# Patient Record
Sex: Female | Born: 1960 | Race: White | Hispanic: Yes | Marital: Married | Smoking: Former smoker
Health system: Southern US, Community
[De-identification: ages and names within clinical notes are randomized; demographics above are authoritative.]

## PROBLEM LIST (undated history)

## (undated) DIAGNOSIS — B019 Varicella without complication: Secondary | ICD-10-CM

## (undated) DIAGNOSIS — N390 Urinary tract infection, site not specified: Secondary | ICD-10-CM

## (undated) DIAGNOSIS — G2581 Restless legs syndrome: Secondary | ICD-10-CM

## (undated) DIAGNOSIS — M069 Rheumatoid arthritis, unspecified: Secondary | ICD-10-CM

## (undated) DIAGNOSIS — K649 Unspecified hemorrhoids: Secondary | ICD-10-CM

## (undated) DIAGNOSIS — B269 Mumps without complication: Secondary | ICD-10-CM

## (undated) DIAGNOSIS — R32 Unspecified urinary incontinence: Secondary | ICD-10-CM

## (undated) DIAGNOSIS — K219 Gastro-esophageal reflux disease without esophagitis: Secondary | ICD-10-CM

## (undated) DIAGNOSIS — E785 Hyperlipidemia, unspecified: Secondary | ICD-10-CM

## (undated) HISTORY — DX: Unspecified hemorrhoids: K64.9

## (undated) HISTORY — DX: Unspecified urinary incontinence: R32

## (undated) HISTORY — DX: Rheumatoid arthritis, unspecified: M06.9

## (undated) HISTORY — DX: Varicella without complication: B01.9

## (undated) HISTORY — DX: Mumps without complication: B26.9

## (undated) HISTORY — DX: Hyperlipidemia, unspecified: E78.5

## (undated) HISTORY — DX: Gastro-esophageal reflux disease without esophagitis: K21.9

## (undated) HISTORY — PX: BREAST ENHANCEMENT SURGERY: SHX7

## (undated) HISTORY — PX: KNEE ARTHROSCOPY: SUR90

## (undated) HISTORY — DX: Urinary tract infection, site not specified: N39.0

## (undated) HISTORY — DX: Restless legs syndrome: G25.81

## (undated) HISTORY — PX: FOOT SURGERY: SHX648

## (undated) HISTORY — PX: AUGMENTATION MAMMAPLASTY: SUR837

---
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
## 2003-10-30 HISTORY — PX: AUGMENTATION MAMMAPLASTY: SUR837

## 2011-10-30 HISTORY — PX: ABDOMINAL HYSTERECTOMY: SHX81

## 2014-02-19 ENCOUNTER — Ambulatory Visit (INDEPENDENT_AMBULATORY_CARE_PROVIDER_SITE_OTHER): Payer: Commercial Managed Care - PPO | Admitting: Physician Assistant

## 2014-02-19 ENCOUNTER — Encounter: Payer: Self-pay | Admitting: Physician Assistant

## 2014-02-19 ENCOUNTER — Encounter: Payer: Self-pay | Admitting: Gastroenterology

## 2014-02-19 VITALS — BP 96/70 | HR 72 | Temp 98.2°F | Resp 16 | Ht 63.5 in | Wt 163.0 lb

## 2014-02-19 DIAGNOSIS — Z23 Encounter for immunization: Secondary | ICD-10-CM

## 2014-02-19 DIAGNOSIS — Z1239 Encounter for other screening for malignant neoplasm of breast: Secondary | ICD-10-CM

## 2014-02-19 DIAGNOSIS — Z0000 Encounter for general adult medical examination without abnormal findings: Secondary | ICD-10-CM

## 2014-02-19 DIAGNOSIS — K648 Other hemorrhoids: Secondary | ICD-10-CM

## 2014-02-19 DIAGNOSIS — Z1211 Encounter for screening for malignant neoplasm of colon: Secondary | ICD-10-CM

## 2014-02-19 DIAGNOSIS — M069 Rheumatoid arthritis, unspecified: Secondary | ICD-10-CM

## 2014-02-19 DIAGNOSIS — F319 Bipolar disorder, unspecified: Secondary | ICD-10-CM

## 2014-02-19 LAB — CBC WITH DIFFERENTIAL/PLATELET
Basophils Absolute: 0 10*3/uL (ref 0.0–0.1)
Basophils Relative: 0 % (ref 0–1)
EOS PCT: 2 % (ref 0–5)
Eosinophils Absolute: 0.1 10*3/uL (ref 0.0–0.7)
HEMATOCRIT: 36.4 % (ref 36.0–46.0)
Hemoglobin: 12.5 g/dL (ref 12.0–15.0)
LYMPHS ABS: 1.6 10*3/uL (ref 0.7–4.0)
LYMPHS PCT: 35 % (ref 12–46)
MCH: 30.3 pg (ref 26.0–34.0)
MCHC: 34.3 g/dL (ref 30.0–36.0)
MCV: 88.1 fL (ref 78.0–100.0)
MONO ABS: 0.6 10*3/uL (ref 0.1–1.0)
MONOS PCT: 13 % — AB (ref 3–12)
Neutro Abs: 2.3 10*3/uL (ref 1.7–7.7)
Neutrophils Relative %: 50 % (ref 43–77)
Platelets: 195 10*3/uL (ref 150–400)
RBC: 4.13 MIL/uL (ref 3.87–5.11)
RDW: 13 % (ref 11.5–15.5)
WBC: 4.6 10*3/uL (ref 4.0–10.5)

## 2014-02-19 LAB — BASIC METABOLIC PANEL
BUN: 15 mg/dL (ref 6–23)
CALCIUM: 9.5 mg/dL (ref 8.4–10.5)
CO2: 30 meq/L (ref 19–32)
CREATININE: 0.73 mg/dL (ref 0.50–1.10)
Chloride: 103 mEq/L (ref 96–112)
GLUCOSE: 90 mg/dL (ref 70–99)
Potassium: 4.2 mEq/L (ref 3.5–5.3)
Sodium: 141 mEq/L (ref 135–145)

## 2014-02-19 LAB — LIPID PANEL
Cholesterol: 249 mg/dL — ABNORMAL HIGH (ref 0–200)
HDL: 67 mg/dL (ref >39)
LDL CALC: 163 mg/dL — AB (ref 0–99)
Total CHOL/HDL Ratio: 3.7 Ratio
Triglycerides: 96 mg/dL (ref <150)
VLDL: 19 mg/dL (ref 0–40)

## 2014-02-19 LAB — HEPATIC FUNCTION PANEL
ALBUMIN: 4.3 g/dL (ref 3.5–5.2)
ALT: 11 U/L (ref 0–35)
AST: 13 U/L (ref 0–37)
Alkaline Phosphatase: 51 U/L (ref 39–117)
Bilirubin, Direct: <0.1 mg/dL (ref 0.0–0.3)
TOTAL PROTEIN: 6.9 g/dL (ref 6.0–8.3)
Total Bilirubin: 0.3 mg/dL (ref 0.2–1.2)

## 2014-02-19 LAB — HEMOGLOBIN A1C
HEMOGLOBIN A1C: 5.7 % — AB (ref <5.7)
MEAN PLASMA GLUCOSE: 117 mg/dL — AB (ref <117)

## 2014-02-19 LAB — TSH: TSH: 3.723 u[IU]/mL (ref 0.350–4.500)

## 2014-02-19 LAB — T4: T4 TOTAL: 5.9 ug/dL (ref 5.0–12.5)

## 2014-02-19 MED ORDER — HYDROCORTISONE ACETATE 25 MG RE SUPP: 25.0000 mg | Freq: Two times a day (BID) | RECTAL | Status: DC

## 2014-02-19 MED ORDER — DIVALPROEX SODIUM 125 MG PO DR TAB: 125.0000 mg | DELAYED_RELEASE_TABLET | Freq: Three times a day (TID) | ORAL | Status: DC

## 2014-02-19 NOTE — Progress Notes (Signed)
Patient presents to clinic today to establish care.  Patient is also fasting for labs.  Acute Concerns: Patient c/o continued mania and depression symptoms consistent with Bipolar Disorder.  Currently on Tegretol 200 mg BID and Celexa 20 mg daily.  Denies SI/HI but does endorse continued depressed mood that alternated with mania.  Husband is present in the room and states she has manic episodes every few weeks.  Denies hospitalization.  Patient has tried numerous medications in the past incluiding lithium, prozac and paxil which she could not tolerate.  Is not currently followed by Psychiatry as she cannot find a specialist in the area that will take her insurance.  In the process of setting her up with a counselor.  Patient also complains of hemorrhoids with occasional BRBPR.  States she was just daignosed with hemorrhoids by her OB/GYN but was not given medication.  Denies pain, tenesmus or melena.  Is due for colonoscopy.  Denies family hx of colorectal cancer.  Chronic Issues: Rheumatoid Arthritis -- new diagnosis.  Followed by speciality.  RLS -- controlled currently without medication.  Health Maintenance: Dental --  Overdue Vision -- Overdue Immunizations -- Unsure of last tetanus.  Will need today. Colonoscopy -- Has never had.  Age 53.   [facility] -- [facility] PAP -- last PAP in January.  Followed by OB/GYN.    Past Medical History  Diagnosis Date  . Chronic rheumatic arthritis   . Hemorrhoids   . Chicken pox   . Mumps   . Hyperlipidemia     Borderline  . Urinary incontinence     Leakage  . UTI (lower urinary tract infection)   . GERD (gastroesophageal reflux disease)   . RLS (restless legs syndrome)     Past Surgical History  Procedure Laterality Date  . Breast enhancement surgery  2005/1992  . Foot surgery      Left, Bers[place]  . Tonsillectomy and adenoidectomy  1972  . Abdominal hysterectomy  2013    No current outpatient  prescriptions on file prior to visit.   No current facility-administered medications on file prior to visit.    Allergies  Allergen Reactions  . Lithium Other (See Comments)    Boils  . Penicillins Hives    Family History  Problem Relation Age of Onset  . Mental illness Mother     Living  . Dementia Mother   . Parkinson's disease Father     Deceased  . Alzheimer's disease Father   . Cancer Maternal Grandmother   . Diabetes Maternal Uncle   . Arthritis Maternal Aunt     Psoriatic   . Alcohol abuse Brother   . Healthy Brother   . Healthy Sister   . Allergies Daughter   . Healthy Daughter   . Healthy Son     x2    History   Social History  . Marital Status: Married    Spouse Name: N/A    Number of Children: N/A  . Years of Education: N/A   Occupational History  . Not on file.   Social History Main Topics  . Smoking status: Current Some Day Smoker  . Smokeless tobacco: Never Used  . Alcohol Use: Not on file     Comment: very rare  . Drug Use: No  . Sexual Activity: Not on file   Other Topics Concern  . Not on file   Social History Narrative  . No narrative on file   Review of Systems  Constitutional: Negative for fever and weight loss.  HENT: Negative for ear pain, hearing loss and tinnitus.   Eyes: Negative for blurred vision and double vision.  Respiratory: Negative for shortness of breath.   Cardiovascular: Negative for chest pain and palpitations.  Gastrointestinal: Positive for blood in stool. Negative for heartburn, nausea, vomiting, abdominal pain, diarrhea, constipation and melena.  Genitourinary: Negative.   Neurological: Negative for dizziness, seizures, loss of consciousness and headaches.  Psychiatric/Behavioral: Positive for depression. Negative for suicidal ideas, hallucinations, memory loss and substance abuse. The patient is nervous/anxious. The patient does not have insomnia.    BP 96/70  Pulse 72  Temp(Src) 98.2 F (36.8 C) (Oral)   Resp 16  Ht 5' 3.5" (1.613 m)  Wt 163 lb (73.936 kg)  BMI 28.42 kg/m2  SpO2 98%  Physical Exam  Vitals reviewed. Constitutional: She is oriented to person, place, and time and well-developed, well-nourished, and in no distress.  HENT:  Head: Normocephalic and atraumatic.  Right Ear: External ear normal.  Left Ear: External ear normal.  Nose: Nose normal.  Mouth/Throat: Oropharynx is clear and moist. No oropharyngeal exudate.  TM within normal limits bilaterally.  Eyes: Conjunctivae and EOM are normal. Pupils are equal, round, and reactive to light.  Neck: Neck supple. No thyromegaly present.  Cardiovascular: Normal rate, regular rhythm, normal heart sounds and intact distal pulses.   Pulmonary/Chest: Effort normal and breath sounds normal. No respiratory distress. She has no wheezes. She has no rales. She exhibits no tenderness.  Abdominal: Soft. Bowel sounds are normal. She exhibits no distension and no mass. There is no tenderness. There is no rebound and no guarding.  Lymphadenopathy:    She has no cervical adenopathy.  Neurological: She is alert and oriented to person, place, and time.  Skin: Skin is warm and dry. No rash noted.  Psychiatric: Memory, affect and judgment normal.   Assessment/Plan: Internal hemorrhoids Rx Anusol. Sitx bath.  Fiber supplement. Scheduled with GI for colonoscopy for CRC screening.  Bipolar disorder Will titrate off of Tegretol while titrating up on Depakote.  Will continue current regimen of Celexa.  Discussed need for specialist input giving treatment failure in the past.  Patient declines at present time.  Follow-up in 2 weeks.  Will reassess symptoms at that time and increase Depakote dosing to reach therapeutic response.  Discussed alarm signs/symptoms with patient and husband.  They know when to proceed to ER.  Visit for preventive health examination Medical history reviewed and updated.  Tetanus immunization given.  Mammogram order placed  for breast cancer screening.  Referral placed to GI for screening colonoscopy and for recurrent hemorrhoids.  Will obtain fasting labs.

## 2014-02-19 NOTE — Progress Notes (Signed)
Pre visit review using our clinic review tool, if applicable. No additional management support is needed unless otherwise documented below in the visit note/SLS  

## 2014-02-19 NOTE — Patient Instructions (Addendum)
Please obtain labs.  I will call you with your results.  You will be contacted for Mammogram and Colonoscopy. Please take the anusol as directed for hemorrhoids.  Increase fluid intake.  Avoid straining.  The colonosocpy will further guide [place] in treatment if not improving with medication.  For Bipolar Disorder -- Continue Celexa for now.  We are switching your from the Tegretol to Depakote.  For the Tegretol -- Start taking 1 pill twice daily for 1 week.  Then take 1 pill daily for an additional week.  At the same time you will start to take the Depakote.  Take 1 tablet twice per day for 1 week. Then increase to 1 tablet three times a day for 1 week.  Then you will take 2 tablets three times per day.  Follow-up in 2 weeks.  If you develop any severe manic symptoms, or if you develop thoughts of harming yourself or others, please call 911 or proceed to the ER.

## 2014-02-20 LAB — URINALYSIS, ROUTINE W REFLEX MICROSCOPIC
Bilirubin Urine: NEGATIVE
Glucose, UA: NEGATIVE mg/dL
Hgb urine dipstick: NEGATIVE
KETONES UR: NEGATIVE mg/dL
Leukocytes, UA: NEGATIVE
NITRITE: NEGATIVE
PROTEIN: NEGATIVE mg/dL
Specific Gravity, Urine: 1.014 (ref 1.005–1.030)
UROBILINOGEN UA: 0.2 mg/dL (ref 0.0–1.0)
pH: 7 (ref 5.0–8.0)

## 2014-02-22 ENCOUNTER — Telehealth: Payer: Self-pay | Admitting: Physician Assistant

## 2014-02-22 NOTE — Telephone Encounter (Signed)
Relevant patient education mailed to patient.  

## 2014-02-24 DIAGNOSIS — Z0000 Encounter for general adult medical examination without abnormal findings: Secondary | ICD-10-CM | POA: Insufficient documentation

## 2014-02-24 DIAGNOSIS — F319 Bipolar disorder, unspecified: Secondary | ICD-10-CM | POA: Insufficient documentation

## 2014-02-24 DIAGNOSIS — M069 Rheumatoid arthritis, unspecified: Secondary | ICD-10-CM | POA: Insufficient documentation

## 2014-02-24 DIAGNOSIS — K648 Other hemorrhoids: Secondary | ICD-10-CM | POA: Insufficient documentation

## 2014-02-24 NOTE — Assessment & Plan Note (Signed)
Medical history reviewed and updated.  Tetanus immunization given.  Mammogram order placed for breast cancer screening.  Referral placed to GI for screening colonoscopy and for recurrent hemorrhoids.  Will obtain fasting labs.

## 2014-02-24 NOTE — Assessment & Plan Note (Addendum)
Rx Anusol. Sitx bath.  Fiber supplement. Scheduled with GI for colonoscopy for CRC screening.

## 2014-02-24 NOTE — Assessment & Plan Note (Signed)
Will titrate off of Tegretol while titrating up on Depakote.  Will continue current regimen of Celexa.  Discussed need for specialist input giving treatment failure in the past.  Patient declines at present time.  Follow-up in 2 weeks.  Will reassess symptoms at that time and increase Depakote dosing to reach therapeutic response.  Discussed alarm signs/symptoms with patient and husband.  They know when to proceed to ER.

## 2014-03-01 ENCOUNTER — Telehealth: Payer: Self-pay | Admitting: Physician Assistant

## 2014-03-01 NOTE — Telephone Encounter (Signed)
Patient's husband called stating we prescribed depakote and she now has hard pimples on her forehead. She would like to know if this will go away, should she stop taking the medication, or is there a cream she can use? Patient's husband states the medication is working. Pt uses [facility] [address]

## 2014-03-01 NOTE — Telephone Encounter (Signed)
You can sometimes get a rash with Depakote although rare.  However, this could be completely unrelated to Depakote. She is due for follow-up next week.  We can evaluate it at that time or they can come in to office sooner.  I would need to know more about the rash and examine it to know what treatments, if needed, would be effective.

## 2014-03-01 NOTE — Telephone Encounter (Signed)
Please Advise/SLS  

## 2014-03-01 NOTE — Telephone Encounter (Signed)
Spoke with patient's husband [caller], patient is at work, will check when she calls home to see if she wants to come in earlier than scheduled 05.14.15 appt; reporting again "hard pimple-like bumps on forehead", provider informed/SLS

## 2014-03-11 ENCOUNTER — Ambulatory Visit: Payer: Commercial Managed Care - PPO | Admitting: Physician Assistant

## 2014-03-13 ENCOUNTER — Other Ambulatory Visit: Payer: Self-pay | Admitting: Physician Assistant

## 2014-03-18 ENCOUNTER — Telehealth: Payer: Self-pay | Admitting: Physician Assistant

## 2014-03-18 ENCOUNTER — Encounter: Payer: Self-pay | Admitting: Physician Assistant

## 2014-03-18 ENCOUNTER — Ambulatory Visit (INDEPENDENT_AMBULATORY_CARE_PROVIDER_SITE_OTHER): Payer: Commercial Managed Care - PPO | Admitting: Physician Assistant

## 2014-03-18 VITALS — BP 90/64 | HR 75 | Temp 98.3°F | Resp 16 | Ht 63.5 in | Wt 162.0 lb

## 2014-03-18 DIAGNOSIS — F319 Bipolar disorder, unspecified: Secondary | ICD-10-CM

## 2014-03-18 DIAGNOSIS — A6000 Herpesviral infection of urogenital system, unspecified: Secondary | ICD-10-CM

## 2014-03-18 MED ORDER — DIVALPROEX SODIUM 250 MG PO DR TAB: DELAYED_RELEASE_TABLET | ORAL | Status: DC

## 2014-03-18 MED ORDER — CITALOPRAM HYDROBROMIDE 20 MG PO TABS: 20.0000 mg | ORAL_TABLET | Freq: Every day | ORAL | Status: DC

## 2014-03-18 MED ORDER — ACYCLOVIR 800 MG PO TABS: 800.0000 mg | ORAL_TABLET | Freq: Every day | ORAL | Status: DC

## 2014-03-18 NOTE — Assessment & Plan Note (Signed)
Doing well. Will increase Depakote to 250 mg TID. Continue current dose of Celexa.  Will check Depakote level.  Follow-up in 1 month.

## 2014-03-18 NOTE — Progress Notes (Signed)
Patient presents to clinic today for follow-up of bipolar disorder after starting Depakote.  Patient currently on 125 mg TID.  Continues Celexa.  States she has noted a marked improvement in her mood.  Endorses that her mood swings are much less frequent and severe.  Denies SI/HI.    Patient is requesting refill of Acyclovir.  Takes 1 tablet daily for genital herpes prophylaxis.  Past Medical History  Diagnosis Date  . Chronic rheumatic arthritis   . Hemorrhoids   . Chicken pox   . Mumps   . Hyperlipidemia     Borderline  . Urinary incontinence     Leakage  . UTI (lower urinary tract infection)   . GERD (gastroesophageal reflux disease)   . RLS (restless legs syndrome)     Current Outpatient Prescriptions on File Prior to Visit  Medication Sig Dispense Refill  . Adalimumab (HUMIRA PEN [facility]) Inject into the skin. HAVE NOT YET STARTED AS OF 04.24.14      . ALPRAZolam (XANAX) 0.5 MG tablet Take 0.5 mg by mouth as needed for anxiety.      . Ascorbic Acid (VITAMIN C) 1000 MG tablet Take 1,000 mg by mouth daily.      . Bacillus Coagulans-Inulin (PROBIOTIC FORMULA) 1-250 BILLION-MG CAPS Take by mouth daily.      . Cranberry-Vitamin C 84-20 MG CAPS Take by mouth daily.      . hydrocortisone (ANUSOL-HC) 25 MG suppository Place 1 suppository (25 mg total) rectally 2 (two) times daily.  12 suppository  0  . Multiple Vitamins-Minerals (CENTRUM ADULTS) TABS Take by mouth daily.      Marland Kitchen rOPINIRole (REQUIP) 0.25 MG tablet Take 0.25 mg by mouth as needed (RLS).       No current facility-administered medications on file prior to visit.    Allergies  Allergen Reactions  . Lithium Other (See Comments)    Boils  . Penicillins Hives    Family History  Problem Relation Age of Onset  . Mental illness Mother     Living  . Dementia Mother   . Parkinson's disease Father     Deceased  . Alzheimer's disease Father   . Cancer Maternal Grandmother   . Diabetes Maternal Uncle   . Arthritis Maternal  Aunt     Psoriatic   . Alcohol abuse Brother   . Healthy Brother   . Healthy Sister   . Allergies Daughter   . Healthy Daughter   . Healthy Son     x2    History   Social History  . Marital Status: Married    Spouse Name: N/A    Number of Children: N/A  . Years of Education: N/A   Social History Main Topics  . Smoking status: Current Some Day Smoker  . Smokeless tobacco: Never Used  . Alcohol Use: None     Comment: very rare  . Drug Use: No  . Sexual Activity: None   Other Topics Concern  . None   Social History Narrative  . None   Review of Systems - See HPI.  All other ROS are negative.  BP 90/64  Pulse 75  Temp(Src) 98.3 F (36.8 C) (Oral)  Resp 16  Ht 5' 3.5" (1.613 m)  Wt 162 lb (73.483 kg)  BMI 28.24 kg/m2  SpO2 98%  Physical Exam  Vitals reviewed. Constitutional: She is oriented to person, place, and time and well-developed, well-nourished, and in no distress.  HENT:  Head: Normocephalic and atraumatic.  Eyes:  Conjunctivae are normal. Pupils are equal, round, and reactive to light.  Cardiovascular: Normal rate, regular rhythm, normal heart sounds and intact distal pulses.   Pulmonary/Chest: Effort normal and breath sounds normal. No respiratory distress. She has no wheezes. She has no rales. She exhibits no tenderness.  Neurological: She is alert and oriented to person, place, and time.  Skin: Skin is warm and dry.  Psychiatric: Mood, memory, affect and judgment normal.    Recent Results (from the past 2160 hour(s))  CBC WITH DIFFERENTIAL     Status: Abnormal   Collection Time    02/19/14  8:48 AM      Result Value Ref Range   WBC 4.6  4.0 - 10.5 K/uL   RBC 4.13  3.87 - 5.11 MIL/uL   Hemoglobin 12.5  12.0 - 15.0 g/dL   HCT 36.4  36.0 - 46.0 %   MCV 88.1  78.0 - 100.0 fL   MCH 30.3  26.0 - 34.0 pg   MCHC 34.3  30.0 - 36.0 g/dL   RDW 13.0  11.5 - 15.5 %   Platelets 195  150 - 400 K/uL   Neutrophils Relative % 50  43 - 77 %   Neutro Abs 2.3   1.7 - 7.7 K/uL   Lymphocytes Relative 35  12 - 46 %   Lymphs Abs 1.6  0.7 - 4.0 K/uL   Monocytes Relative 13 (*) 3 - 12 %   Monocytes Absolute 0.6  0.1 - 1.0 K/uL   Eosinophils Relative 2  0 - 5 %   Eosinophils Absolute 0.1  0.0 - 0.7 K/uL   Basophils Relative 0  0 - 1 %   Basophils Absolute 0.0  0.0 - 0.1 K/uL   Smear Review Criteria for review not met    BASIC METABOLIC PANEL     Status: None   Collection Time    02/19/14  8:48 AM      Result Value Ref Range   Sodium 141  135 - 145 mEq/L   Potassium 4.2  3.5 - 5.3 mEq/L   Chloride 103  96 - 112 mEq/L   CO2 30  19 - 32 mEq/L   Glucose, Bld 90  70 - 99 mg/dL   BUN 15  6 - 23 mg/dL   Creat 0.73  0.50 - 1.10 mg/dL   Calcium 9.5  8.4 - 10.5 mg/dL  HEPATIC FUNCTION PANEL     Status: None   Collection Time    02/19/14  8:48 AM      Result Value Ref Range   Total Bilirubin 0.3  0.2 - 1.2 mg/dL   Bilirubin, Direct <0.1  0.0 - 0.3 mg/dL   Indirect Bilirubin NOT CALC  0.2 - 1.2 mg/dL   Alkaline Phosphatase 51  39 - 117 U/L   AST 13  0 - 37 U/L   ALT 11  0 - 35 U/L   Total Protein 6.9  6.0 - 8.3 g/dL   Albumin 4.3  3.5 - 5.2 g/dL  TSH     Status: None   Collection Time    02/19/14  8:48 AM      Result Value Ref Range   TSH 3.723  0.350 - 4.500 uIU/mL  HEMOGLOBIN A1C     Status: Abnormal   Collection Time    02/19/14  8:48 AM      Result Value Ref Range   Hemoglobin A1C 5.7 (*) <5.7 %   Comment:  According to the ADA Clinical Practice Recommendations for 2011, when     HbA1c is used as a screening test:             >=6.5%   Diagnostic of Diabetes Mellitus                (if abnormal result is confirmed)           5.7-6.4%   Increased risk of developing Diabetes Mellitus           References:Diagnosis and Classification of Diabetes Mellitus,Diabetes     KPTW,6568,12(XNTZG 1):S62-S69 and Standards of Medical Care in             Diabetes -  2011,Diabetes YFVC,9449,67 (Suppl 1):S11-S61.         Mean Plasma Glucose 117 (*) <117 mg/dL  URINALYSIS, ROUTINE W REFLEX MICROSCOPIC     Status: None   Collection Time    02/19/14  8:48 AM      Result Value Ref Range   Color, Urine YELLOW  YELLOW   APPearance CLEAR  CLEAR   Specific Gravity, Urine 1.014  1.005 - 1.030   pH 7.0  5.0 - 8.0   Glucose, UA NEG  NEG mg/dL   Bilirubin Urine NEG  NEG   Ketones, ur NEG  NEG mg/dL   Hgb urine dipstick NEG  NEG   Protein, ur NEG  NEG mg/dL   Urobilinogen, UA 0.2  0.0 - 1.0 mg/dL   Nitrite NEG  NEG   Leukocytes, UA NEG  NEG  LIPID PANEL     Status: Abnormal   Collection Time    02/19/14  8:48 AM      Result Value Ref Range   Cholesterol 249 (*) 0 - 200 mg/dL   Comment: ATP III Classification:           < 200        mg/dL        Desirable          200 - 239     mg/dL        Borderline High          >= 240        mg/dL        High         Triglycerides 96  <150 mg/dL   HDL 67  >39 mg/dL   Total CHOL/HDL Ratio 3.7     VLDL 19  0 - 40 mg/dL   LDL Cholesterol 163 (*) 0 - 99 mg/dL   Comment:       Total Cholesterol/HDL Ratio:CHD Risk                            Coronary Heart Disease Risk Table                                            Men       Women              1/2 Average Risk              3.4        3.3                  Average Risk  5.0        4.4               2X Average Risk              9.6        7.1               3X Average Risk             23.4       11.0     Use the calculated Patient Ratio above and the CHD Risk table      to determine the patient's CHD Risk.     ATP III Classification (LDL):           < 100        mg/dL         Optimal          100 - 129     mg/dL         Near or Above Optimal          130 - 159     mg/dL         Borderline High          160 - 189     mg/dL         High           > 190        mg/dL         Very High        T4     Status: None   Collection Time    02/19/14  8:48 AM       Result Value Ref Range   T4, Total 5.9  5.0 - 12.5 ug/dL   Assessment/Plan: Bipolar disorder Doing well. Will increase Depakote to 250 mg TID. Continue current dose of Celexa.  Will check Depakote level.  Follow-up in 1 month.

## 2014-03-18 NOTE — Progress Notes (Signed)
Pre visit review using our clinic review tool, if applicable. No additional management support is needed unless otherwise documented below in the visit note/SLS  

## 2014-03-18 NOTE — Patient Instructions (Signed)
Please continue Citalopram as directed.  Start new depakote dose. 250 mg three times per day.  Please obtain labs.  Follow-up in 1 month.

## 2014-03-18 NOTE — Telephone Encounter (Signed)
Relevant patient education mailed to patient.  

## 2014-03-19 LAB — VALPROIC ACID LEVEL: Valproic Acid Lvl: 34.7 ug/mL — ABNORMAL LOW (ref 50.0–100.0)

## 2014-03-26 ENCOUNTER — Telehealth: Payer: Self-pay

## 2014-03-26 NOTE — Telephone Encounter (Signed)
No id on voicemail  No show for previsit  Procedure cancelled

## 2014-04-09 ENCOUNTER — Encounter: Payer: Commercial Managed Care - PPO | Admitting: Gastroenterology

## 2014-04-29 ENCOUNTER — Ambulatory Visit (INDEPENDENT_AMBULATORY_CARE_PROVIDER_SITE_OTHER): Payer: Commercial Managed Care - PPO | Admitting: Physician Assistant

## 2014-04-29 VITALS — BP 100/64 | HR 70 | Temp 98.5°F | Resp 16 | Ht 63.5 in | Wt 151.0 lb

## 2014-04-29 DIAGNOSIS — F3161 Bipolar disorder, current episode mixed, mild: Secondary | ICD-10-CM

## 2014-04-29 DIAGNOSIS — Z5181 Encounter for therapeutic drug level monitoring: Secondary | ICD-10-CM

## 2014-04-29 NOTE — Progress Notes (Deleted)
   Subjective:    Patient ID: Stephanie Spencer, female    DOB: January 30, 1961, 53 y.o.   MRN: [id number]  HPI    Review of Systems     Objective:   Physical Exam        Assessment & Plan:

## 2014-04-29 NOTE — Progress Notes (Signed)
Patient presents to clinic today for follow-up of Bipolar disorder.  Patient endorses doing very well since increase of her Depakote.  Denies mood swings, depressed mood, manic episode, SI/HI.  Husband is present and notes a marked improvement in the patient's mood as well.  No new concerns at today's visit.  Patient is due for repeat Valproic Acid level.  Past Medical History  Diagnosis Date  . Chronic rheumatic arthritis   . Hemorrhoids   . Chicken pox   . Mumps   . Hyperlipidemia     Borderline  . Urinary incontinence     Leakage  . UTI (lower urinary tract infection)   . GERD (gastroesophageal reflux disease)   . RLS (restless legs syndrome)     Current Outpatient Prescriptions on File Prior to Visit  Medication Sig Dispense Refill  . acyclovir (ZOVIRAX) 800 MG tablet Take 1 tablet (800 mg total) by mouth daily.  30 tablet  3  . Adalimumab (HUMIRA PEN [place]) Inject into the skin. HAVE NOT YET STARTED AS OF 04.24.14      . ALPRAZolam (XANAX) 0.5 MG tablet Take 0.5 mg by mouth as needed for anxiety.      . Ascorbic Acid (VITAMIN C) 1000 MG tablet Take 1,000 mg by mouth daily.      . Bacillus Coagulans-Inulin (PROBIOTIC FORMULA) 1-250 BILLION-MG CAPS Take by mouth daily.      . citalopram (CELEXA) 20 MG tablet Take 1 tablet (20 mg total) by mouth daily.  30 tablet  3  . Cranberry-Vitamin C 84-20 MG CAPS Take by mouth daily.      . hydrocortisone (ANUSOL-HC) 25 MG suppository Place 1 suppository (25 mg total) rectally 2 (two) times daily.  12 suppository  0  . Multiple Vitamins-Minerals (CENTRUM ADULTS) TABS Take by mouth daily.      Marland Kitchen rOPINIRole (REQUIP) 0.25 MG tablet Take 0.25 mg by mouth as needed (RLS).       No current facility-administered medications on file prior to visit.    Allergies  Allergen Reactions  . Lithium Other (See Comments)    Boils  . Penicillins Hives    Family History  Problem Relation Age of Onset  . Mental illness Mother     Living  . Dementia  Mother   . Parkinson's disease Father     Deceased  . Alzheimer's disease Father   . Cancer Maternal Grandmother   . Diabetes Maternal Uncle   . Arthritis Maternal Aunt     Psoriatic   . Alcohol abuse Brother   . Healthy Brother   . Healthy Sister   . Allergies Daughter   . Healthy Daughter   . Healthy Son     x2    History   Social History  . Marital Status: Married    Spouse Name: N/A    Number of Children: N/A  . Years of Education: N/A   Social History Main Topics  . Smoking status: Current Some Day Smoker  . Smokeless tobacco: Never Used  . Alcohol Use: Not on file     Comment: very rare  . Drug Use: No  . Sexual Activity: Not on file   Other Topics Concern  . Not on file   Social History Narrative  . No narrative on file   Review of Systems - See HPI.  All other ROS are negative.  BP 100/64  Pulse 70  Temp(Src) 98.5 F (36.9 C) (Oral)  Resp 16  Ht 5' 3.5" (1.613  m)  Wt 151 lb (68.493 kg)  BMI 26.33 kg/m2  SpO2 98%  Physical Exam  Vitals reviewed. Constitutional: She is oriented to person, place, and time and well-developed, well-nourished, and in no distress.  HENT:  Head: Normocephalic and atraumatic.  Cardiovascular: Normal rate, regular rhythm, normal heart sounds and intact distal pulses.   Pulmonary/Chest: Effort normal and breath sounds normal. No respiratory distress. She has no wheezes. She has no rales. She exhibits no tenderness.  Neurological: She is alert and oriented to person, place, and time.  Skin: Skin is warm and dry. No rash noted.  Psychiatric: Affect normal.   Recent Results (from the past 2160 hour(s))  CBC WITH DIFFERENTIAL     Status: Abnormal   Collection Time    02/19/14  8:48 AM      Result Value Ref Range   WBC 4.6  4.0 - 10.5 K/uL   RBC 4.13  3.87 - 5.11 MIL/uL   Hemoglobin 12.5  12.0 - 15.0 g/dL   HCT 36.4  36.0 - 46.0 %   MCV 88.1  78.0 - 100.0 fL   MCH 30.3  26.0 - 34.0 pg   MCHC 34.3  30.0 - 36.0 g/dL    RDW 13.0  11.5 - 15.5 %   Platelets 195  150 - 400 K/uL   Neutrophils Relative % 50  43 - 77 %   Neutro Abs 2.3  1.7 - 7.7 K/uL   Lymphocytes Relative 35  12 - 46 %   Lymphs Abs 1.6  0.7 - 4.0 K/uL   Monocytes Relative 13 (*) 3 - 12 %   Monocytes Absolute 0.6  0.1 - 1.0 K/uL   Eosinophils Relative 2  0 - 5 %   Eosinophils Absolute 0.1  0.0 - 0.7 K/uL   Basophils Relative 0  0 - 1 %   Basophils Absolute 0.0  0.0 - 0.1 K/uL   Smear Review Criteria for review not met    BASIC METABOLIC PANEL     Status: None   Collection Time    02/19/14  8:48 AM      Result Value Ref Range   Sodium 141  135 - 145 mEq/L   Potassium 4.2  3.5 - 5.3 mEq/L   Chloride 103  96 - 112 mEq/L   CO2 30  19 - 32 mEq/L   Glucose, Bld 90  70 - 99 mg/dL   BUN 15  6 - 23 mg/dL   Creat 0.73  0.50 - 1.10 mg/dL   Calcium 9.5  8.4 - 10.5 mg/dL  HEPATIC FUNCTION PANEL     Status: None   Collection Time    02/19/14  8:48 AM      Result Value Ref Range   Total Bilirubin 0.3  0.2 - 1.2 mg/dL   Bilirubin, Direct <0.1  0.0 - 0.3 mg/dL   Indirect Bilirubin NOT CALC  0.2 - 1.2 mg/dL   Alkaline Phosphatase 51  39 - 117 U/L   AST 13  0 - 37 U/L   ALT 11  0 - 35 U/L   Total Protein 6.9  6.0 - 8.3 g/dL   Albumin 4.3  3.5 - 5.2 g/dL  TSH     Status: None   Collection Time    02/19/14  8:48 AM      Result Value Ref Range   TSH 3.723  0.350 - 4.500 uIU/mL  HEMOGLOBIN A1C     Status: Abnormal   Collection Time  02/19/14  8:48 AM      Result Value Ref Range   Hemoglobin A1C 5.7 (*) <5.7 %   Comment:                                                                            According to the ADA Clinical Practice Recommendations for 2011, when     HbA1c is used as a screening test:             >=6.5%   Diagnostic of Diabetes Mellitus                (if abnormal result is confirmed)           5.7-6.4%   Increased risk of developing Diabetes Mellitus           References:Diagnosis and Classification of Diabetes  Mellitus,Diabetes     WUJW,1191,47(WGNFA 1):S62-S69 and Standards of Medical Care in             Diabetes - 2011,Diabetes Care,2011,34 (Suppl 1):S11-S61.         Mean Plasma Glucose 117 (*) <117 mg/dL  URINALYSIS, ROUTINE W REFLEX MICROSCOPIC     Status: None   Collection Time    02/19/14  8:48 AM      Result Value Ref Range   Color, Urine YELLOW  YELLOW   APPearance CLEAR  CLEAR   Specific Gravity, Urine 1.014  1.005 - 1.030   pH 7.0  5.0 - 8.0   Glucose, UA NEG  NEG mg/dL   Bilirubin Urine NEG  NEG   Ketones, ur NEG  NEG mg/dL   Hgb urine dipstick NEG  NEG   Protein, ur NEG  NEG mg/dL   Urobilinogen, UA 0.2  0.0 - 1.0 mg/dL   Nitrite NEG  NEG   Leukocytes, UA NEG  NEG  LIPID PANEL     Status: Abnormal   Collection Time    02/19/14  8:48 AM      Result Value Ref Range   Cholesterol 249 (*) 0 - 200 mg/dL   Comment: ATP III Classification:           < 200        mg/dL        Desirable          200 - 239     mg/dL        Borderline High          >= 240        mg/dL        High         Triglycerides 96  <150 mg/dL   HDL 67  >39 mg/dL   Total CHOL/HDL Ratio 3.7     VLDL 19  0 - 40 mg/dL   LDL Cholesterol 163 (*) 0 - 99 mg/dL   Comment:       Total Cholesterol/HDL Ratio:CHD Risk                            Coronary Heart Disease Risk Table  Men       Women              1/2 Average Risk              3.4        3.3                  Average Risk              5.0        4.4               2X Average Risk              9.6        7.1               3X Average Risk             23.4       11.0     Use the calculated Patient Ratio above and the CHD Risk table      to determine the patient's CHD Risk.     ATP III Classification (LDL):           < 100        mg/dL         Optimal          100 - 129     mg/dL         Near or Above Optimal          130 - 159     mg/dL         Borderline High          160 - 189     mg/dL         High           >  190        mg/dL         Very High        T4     Status: None   Collection Time    02/19/14  8:48 AM      Result Value Ref Range   T4, Total 5.9  5.0 - 12.5 ug/dL  VALPROIC ACID LEVEL     Status: Abnormal   Collection Time    03/18/14  2:35 PM      Result Value Ref Range   Valproic Acid Lvl 34.7 (*) 50.0 - 100.0 ug/mL  VALPROIC ACID LEVEL     Status: None   Collection Time    04/29/14  4:24 PM      Result Value Ref Range   Valproic Acid Lvl 59.4  50.0 - 100.0 ug/mL   Assessment/Plan: Bipolar disorder Patient doing well.  Continue current dosing regimen.  Will check Valproic Acid Level.  Follow-up as scheduled.

## 2014-04-29 NOTE — Progress Notes (Signed)
Pre visit review using our clinic review tool, if applicable. No additional management support is needed unless otherwise documented below in the visit note/SLS  

## 2014-04-29 NOTE — Patient Instructions (Signed)
Please continue medications as directed.  Obtain labs. I will call you with your results.  IF labs look good we will follow-up in 6 months.  You may have to return sooner for labs.

## 2014-04-30 LAB — VALPROIC ACID LEVEL: Valproic Acid Lvl: 59.4 ug/mL (ref 50.0–100.0)

## 2014-05-05 ENCOUNTER — Encounter: Payer: Self-pay | Admitting: Physician Assistant

## 2014-05-05 NOTE — Assessment & Plan Note (Signed)
Patient doing well.  Continue current dosing regimen.  Will check Valproic Acid Level.  Follow-up as scheduled.

## 2014-05-24 ENCOUNTER — Other Ambulatory Visit: Payer: Self-pay | Admitting: Physician Assistant

## 2014-05-24 NOTE — Telephone Encounter (Signed)
eScribe request from [facility] for refill on Depakote 250 mg Last filled - 05.21.15, #[id number] Last AEX - 07.02.15 Next AEX - 3 Mths. Please Advise on refills/SLS

## 2014-06-17 ENCOUNTER — Other Ambulatory Visit: Payer: Self-pay | Admitting: Physician Assistant

## 2014-07-23 ENCOUNTER — Ambulatory Visit (INDEPENDENT_AMBULATORY_CARE_PROVIDER_SITE_OTHER): Payer: Commercial Managed Care - PPO | Admitting: Physician Assistant

## 2014-07-23 ENCOUNTER — Encounter: Payer: Self-pay | Admitting: Physician Assistant

## 2014-07-23 VITALS — BP 100/62 | HR 67 | Temp 98.4°F | Resp 16 | Ht 63.5 in | Wt 147.2 lb

## 2014-07-23 DIAGNOSIS — Z5181 Encounter for therapeutic drug level monitoring: Secondary | ICD-10-CM

## 2014-07-23 DIAGNOSIS — B002 Herpesviral gingivostomatitis and pharyngotonsillitis: Secondary | ICD-10-CM

## 2014-07-23 DIAGNOSIS — F3177 Bipolar disorder, in partial remission, most recent episode mixed: Secondary | ICD-10-CM

## 2014-07-23 MED ORDER — ACYCLOVIR 800 MG PO TABS: 800.0000 mg | ORAL_TABLET | Freq: Every day | ORAL | Status: DC

## 2014-07-23 MED ORDER — DIVALPROEX SODIUM 250 MG PO DR TAB: 250.0000 mg | DELAYED_RELEASE_TABLET | Freq: Three times a day (TID) | ORAL | Status: DC

## 2014-07-23 MED ORDER — CITALOPRAM HYDROBROMIDE 20 MG PO TABS: 20.0000 mg | ORAL_TABLET | Freq: Every day | ORAL | Status: DC

## 2014-07-23 NOTE — Progress Notes (Signed)
Pre visit review using our clinic review tool, if applicable. No additional management support is needed unless otherwise documented below in the visit note/SLS  

## 2014-07-23 NOTE — Patient Instructions (Signed)
Please go to the lab for blood work.  I will call you with your results.  Increase DEpakote to three times daily.  I have refilled your medications.  Follow-up with me in 1 month.

## 2014-07-24 LAB — VALPROIC ACID LEVEL: VALPROIC ACID LVL: 57.6 ug/mL (ref 50.0–100.0)

## 2014-07-25 DIAGNOSIS — B002 Herpesviral gingivostomatitis and pharyngotonsillitis: Secondary | ICD-10-CM | POA: Insufficient documentation

## 2014-07-25 NOTE — Assessment & Plan Note (Signed)
Will attempt increase in Depakote to 250 mg TID to help tighten management of mood. Will obtain repeat Depakote level to ensure medication compliance.  Follow-up in 1 month.

## 2014-07-25 NOTE — Progress Notes (Signed)
Patient presents to clinic today for follow-up of bipolar disorder, mixed.  Patient previously well controlled with Depakote 250 mg BID.  Endorses continuing medication as directed.  States she is noticing some increased irritability.  Denies SI/HI.  Denies manic episode.  Is due for repeat Depakote level.  Past Medical History  Diagnosis Date  . Chronic rheumatic arthritis   . Hemorrhoids   . Chicken pox   . Mumps   . Hyperlipidemia     Borderline  . Urinary incontinence     Leakage  . UTI (lower urinary tract infection)   . GERD (gastroesophageal reflux disease)   . RLS (restless legs syndrome)     Current Outpatient Prescriptions on File Prior to Visit  Medication Sig Dispense Refill  . Adalimumab (HUMIRA PEN [place]) Inject into the skin.       Marland Kitchen ALPRAZolam (XANAX) 0.5 MG tablet Take 0.5 mg by mouth as needed for anxiety.      . Ascorbic Acid (VITAMIN C) 1000 MG tablet Take 1,000 mg by mouth daily.      . Bacillus Coagulans-Inulin (PROBIOTIC FORMULA) 1-250 BILLION-MG CAPS Take by mouth daily.      . Cranberry-Vitamin C 84-20 MG CAPS Take by mouth daily.      . Multiple Vitamins-Minerals (CENTRUM ADULTS) TABS Take by mouth daily.      Marland Kitchen rOPINIRole (REQUIP) 0.25 MG tablet Take 0.25 mg by mouth as needed (RLS).       No current facility-administered medications on file prior to visit.    Allergies  Allergen Reactions  . Lithium Other (See Comments)    Boils  . Penicillins Hives    Family History  Problem Relation Age of Onset  . Mental illness Mother     Living  . Dementia Mother   . Parkinson's disease Father     Deceased  . Alzheimer's disease Father   . Cancer Maternal Grandmother   . Diabetes Maternal Uncle   . Arthritis Maternal Aunt     Psoriatic   . Alcohol abuse Brother   . Healthy Brother   . Healthy Sister   . Allergies Daughter   . Healthy Daughter   . Healthy Son     x2    History   Social History  . Marital Status: Married    Spouse Name:  N/A    Number of Children: N/A  . Years of Education: N/A   Social History Main Topics  . Smoking status: Current Some Day Smoker  . Smokeless tobacco: Never Used  . Alcohol Use: None     Comment: very rare  . Drug Use: No  . Sexual Activity: None   Other Topics Concern  . None   Social History Narrative  . None    Review of Systems - See HPI.  All other ROS are negative.  BP 100/62  Pulse 67  Temp(Src) 98.4 F (36.9 C) (Oral)  Resp 16  Ht 5' 3.5" (1.613 m)  Wt 147 lb 4 oz (66.792 kg)  BMI 25.67 kg/m2  SpO2 100%  Physical Exam  Vitals reviewed. Constitutional: She is oriented to person, place, and time and well-developed, well-nourished, and in no distress.  HENT:  Head: Normocephalic and atraumatic.  Right Ear: External ear normal.  Left Ear: External ear normal.  Nose: Nose normal.  Mouth/Throat: Oropharynx is clear and moist. No oropharyngeal exudate.  Eyes: Conjunctivae are normal. Pupils are equal, round, and reactive to light.  Neck: Neck supple.  Cardiovascular: Normal rate, regular rhythm, normal heart sounds and intact distal pulses.   Pulmonary/Chest: Effort normal and breath sounds normal. No respiratory distress. She has no wheezes. She has no rales. She exhibits no tenderness.  Neurological: She is alert and oriented to person, place, and time.  Skin: Skin is warm and dry. No rash noted.  Psychiatric: Affect normal.   Recent Results (from the past 2160 hour(s))  VALPROIC ACID LEVEL     Status: None   Collection Time    04/29/14  4:24 PM      Result Value Ref Range   Valproic Acid Lvl 59.4  50.0 - 100.0 ug/mL  VALPROIC ACID LEVEL     Status: None   Collection Time    07/23/14  4:00 PM      Result Value Ref Range   Valproic Acid Lvl 57.6  50.0 - 100.0 ug/mL    Assessment/Plan: Bipolar disorder Will attempt increase in Depakote to 250 mg TID to help tighten management of mood. Will obtain repeat Depakote level to ensure medication compliance.   Follow-up in 1 month.  Recurrent oral herpes simplex No current outbreak. Prophylactic medication refilled.

## 2014-07-25 NOTE — Assessment & Plan Note (Addendum)
No current outbreak. Prophylactic medication refilled.

## 2014-08-23 ENCOUNTER — Telehealth: Payer: Self-pay | Admitting: Physician Assistant

## 2014-08-23 NOTE — Telephone Encounter (Signed)
Please inform patient to call her pharmacy where she has three [3] remaining refills on prescription at #45/SLS Thanks.

## 2014-08-23 NOTE — Telephone Encounter (Signed)
Medication Detail      Disp Refills Start End     acyclovir (ZOVIRAX) 800 MG tablet 45 tablet 3 07/23/2014     Sig - Route: Take 1 tablet (800 mg total) by mouth daily. - Oral    E-Prescribing Status: Receipt confirmed by pharmacy (07/23/2014 4:03 PM EDT)    Associated Diagnoses    Recurrent oral herpes simplex    Pharmacy    [id number] - [place], [place] - [address]

## 2014-08-23 NOTE — Telephone Encounter (Signed)
Caller name:Beldin, Wynonna Relation to pt: self  Call back number:  [phone]  Pharmacy:  Reason for call:  Pt requesting a refill of acyclovir (ZOVIRAX) 800 MG tablet and requesting 45 day supply due to the fact pt would like a few pills for prn needs just case she has a outbreak.

## 2014-08-27 ENCOUNTER — Telehealth: Payer: Self-pay | Admitting: *Deleted

## 2014-08-27 ENCOUNTER — Ambulatory Visit: Payer: Commercial Managed Care - PPO | Admitting: Physician Assistant

## 2014-08-27 DIAGNOSIS — Z0289 Encounter for other administrative examinations: Secondary | ICD-10-CM

## 2014-08-27 NOTE — Telephone Encounter (Signed)
Pt did not show for appointment 08/27/2014 at 3:30pm for 1 month follow up

## 2014-08-27 NOTE — Telephone Encounter (Signed)
Please call to reschedule appointment.

## 2014-09-11 ENCOUNTER — Other Ambulatory Visit: Payer: Self-pay | Admitting: Physician Assistant

## 2014-09-13 NOTE — Telephone Encounter (Signed)
Medication Detail      Disp Refills Start End     divalproex (DEPAKOTE) 250 MG DR tablet 90 tablet 1 07/23/2014     Sig - Route: Take 1 tablet (250 mg total) by mouth 3 (three) times daily. TAKE 1 TABLET BY MOUTH TWO TO THREE TIMES DAILY - Oral    E-Prescribing Status: Receipt confirmed by pharmacy (07/23/2014 4:03 PM EDT)      Last OV: 09.25.15 F/U: 1 Mth. [No Show 10.30.15] Please Advise [early request] on refills AND clarify Sig Instructions/SLS

## 2014-09-14 NOTE — Telephone Encounter (Signed)
Spoke with pt, she is in [place].  Pt said she will call and reschedule around the first of December.

## 2014-11-06 ENCOUNTER — Other Ambulatory Visit: Payer: Self-pay | Admitting: Physician Assistant

## 2014-11-08 NOTE — Telephone Encounter (Signed)
Medication Detail      Disp Refills Start End     citalopram (CELEXA) 20 MG tablet 30 tablet 3 07/23/2014     Sig - Route: Take 1 tablet (20 mg total) by mouth daily. - Oral    E-Prescribing Status: Receipt confirmed by pharmacy (07/23/2014 4:03 PM EDT)      Patient Instructions     Please go to the lab for blood work. I will call you with your results. Increase DEpakote to three times daily. I have refilled your medications. Follow-up with me in 1 month.  NO SHOW 10.30.15 appointment   HOLD Until due for Refill 01.23.16/SLS

## 2014-11-09 ENCOUNTER — Encounter: Payer: Self-pay | Admitting: Physician Assistant

## 2014-11-09 ENCOUNTER — Ambulatory Visit (INDEPENDENT_AMBULATORY_CARE_PROVIDER_SITE_OTHER): Payer: Commercial Managed Care - PPO | Admitting: Physician Assistant

## 2014-11-09 VITALS — BP 101/58 | HR 73 | Temp 98.4°F | Resp 16 | Ht 63.5 in | Wt 148.2 lb

## 2014-11-09 DIAGNOSIS — J0101 Acute recurrent maxillary sinusitis: Secondary | ICD-10-CM

## 2014-11-09 MED ORDER — FLUCONAZOLE 150 MG PO TABS: ORAL_TABLET | ORAL | Status: DC

## 2014-11-09 MED ORDER — HYDROCOD POLST-CHLORPHEN POLST 10-8 MG/5ML PO LQCR: 5.0000 mL | Freq: Two times a day (BID) | ORAL | Status: DC | PRN

## 2014-11-09 MED ORDER — DOXYCYCLINE MONOHYDRATE 100 MG PO CAPS: 100.0000 mg | ORAL_CAPSULE | Freq: Two times a day (BID) | ORAL | Status: DC

## 2014-11-09 NOTE — Patient Instructions (Signed)
Please take antibiotic as directed.  Increase fluid intake.  Use Saline nasal spray.  Take a daily multivitamin. Use Tussionex as directed for cough.  Place a humidifier in the bedroom.  Please call or return clinic if symptoms are not improving.  Sinusitis Sinusitis is redness, soreness, and swelling (inflammation) of the paranasal sinuses. Paranasal sinuses are air pockets within the bones of your face (beneath the eyes, the middle of the forehead, or above the eyes). In healthy paranasal sinuses, mucus is able to drain out, and air is able to circulate through them by way of your nose. However, when your paranasal sinuses are inflamed, mucus and air can become trapped. This can allow bacteria and other germs to grow and cause infection. Sinusitis can develop quickly and last only a short time (acute) or continue over a long period (chronic). Sinusitis that lasts for more than 12 weeks is considered chronic.  CAUSES  Causes of sinusitis include:  Allergies.  Structural abnormalities, such as displacement of the cartilage that separates your nostrils (deviated septum), which can decrease the air flow through your nose and sinuses and affect sinus drainage.  Functional abnormalities, such as when the small hairs (cilia) that line your sinuses and help remove mucus do not work properly or are not present. SYMPTOMS  Symptoms of acute and chronic sinusitis are the same. The primary symptoms are pain and pressure around the affected sinuses. Other symptoms include:  Upper toothache.  Earache.  Headache.  Bad breath.  Decreased sense of smell and taste.  A cough, which worsens when you are lying flat.  Fatigue.  Fever.  Thick drainage from your nose, which often is green and may contain pus (purulent).  Swelling and warmth over the affected sinuses. DIAGNOSIS  Your caregiver will perform a physical exam. During the exam, your caregiver may:  Look in your nose for signs of abnormal  growths in your nostrils (nasal polyps).  Tap over the affected sinus to check for signs of infection.  View the inside of your sinuses (endoscopy) with a special imaging device with a light attached (endoscope), which is inserted into your sinuses. If your caregiver suspects that you have chronic sinusitis, one or more of the following tests may be recommended:  Allergy tests.  Nasal culture A sample of mucus is taken from your nose and sent to a lab and screened for bacteria.  Nasal cytology A sample of mucus is taken from your nose and examined by your caregiver to determine if your sinusitis is related to an allergy. TREATMENT  Most cases of acute sinusitis are related to a viral infection and will resolve on their own within 10 days. Sometimes medicines are prescribed to help relieve symptoms (pain medicine, decongestants, nasal steroid sprays, or saline sprays).  However, for sinusitis related to a bacterial infection, your caregiver will prescribe antibiotic medicines. These are medicines that will help kill the bacteria causing the infection.  Rarely, sinusitis is caused by a fungal infection. In theses cases, your caregiver will prescribe antifungal medicine. For some cases of chronic sinusitis, surgery is needed. Generally, these are cases in which sinusitis recurs more than 3 times per year, despite other treatments. HOME CARE INSTRUCTIONS   Drink plenty of water. Water helps thin the mucus so your sinuses can drain more easily.  Use a humidifier.  Inhale steam 3 to 4 times a day (for example, sit in the bathroom with the shower running).  Apply a warm, moist washcloth to your face   3 to 4 times a day, or as directed by your caregiver.  Use saline nasal sprays to help moisten and clean your sinuses.  Take over-the-counter or prescription medicines for pain, discomfort, or fever only as directed by your caregiver. SEEK IMMEDIATE MEDICAL CARE IF:  You have increasing pain or  severe headaches.  You have nausea, vomiting, or drowsiness.  You have swelling around your face.  You have vision problems.  You have a stiff neck.  You have difficulty breathing. MAKE SURE YOU:   Understand these instructions.  Will watch your condition.  Will get help right away if you are not doing well or get worse. Document Released: 10/15/2005 Document Revised: 01/07/2012 Document Reviewed: 10/30/2011 Exit[employer] Patient Information 2014 [employer].   

## 2014-11-09 NOTE — Progress Notes (Signed)
  Subjective:     Stephanie Spencer is a 54 y.o. female who presents for evaluation of symptoms of a URI, possible sinusitis. Symptoms include bilateral ear pressure/pain, achiness, congestion, cough described as nonproductive, facial pain, post nasal drip, sore throat and tooth pain. Onset of symptoms was 5 days ago, and has been rapidly worsening since that time. Treatment to date: none.  The following portions of the patient's history were reviewed and updated as appropriate: allergies, current medications, past family history, past medical history, past social history, past surgical history and problem list.  Review of Systems Pertinent items are noted in HPI.   Objective:    BP 101/58 mmHg  Pulse 73  Temp(Src) 98.4 F (36.9 C) (Oral)  Resp 16  Ht 5' 3.5" (1.613 m)  Wt 148 lb 4 oz (67.246 kg)  BMI 25.85 kg/m2  SpO2 100% General appearance: alert, cooperative, appears stated age and no distress Head: Normocephalic, without obvious abnormality, atraumatic Eyes: conjunctivae/corneas clear. PERRL, EOM's intact. Fundi benign. Ears: normal TM's and external ear canals both ears Nose: clear discharge, moderate congestion, sinus tenderness bilateral Throat: lips, mucosa, and tongue normal; teeth and gums normal Lungs: clear to auscultation bilaterally Heart: regular rate and rhythm, S1, S2 normal, no murmur, click, rub or gallop Skin: Skin color, texture, turgor normal. No rashes or lesions Lymph nodes: Cervical, supraclavicular, and axillary nodes normal.   Assessment:    sinusitis   Plan:    Discussed the diagnosis and treatment of sinusitis. Suggested symptomatic OTC remedies. Nasal saline spray for congestion. Zithromax per orders. Follow up as needed.

## 2014-11-09 NOTE — Progress Notes (Signed)
Pre visit review using our clinic review tool, if applicable. No additional management support is needed unless otherwise documented below in the visit note/SLS  

## 2014-11-15 ENCOUNTER — Telehealth: Payer: Self-pay | Admitting: Physician Assistant

## 2014-11-15 NOTE — Telephone Encounter (Signed)
Caller name: [facility]  Relation to pt: other  Call back [phone]   Reason for call:  requesting a refill   citalopram (CELEXA) 20 MG tablet

## 2014-11-15 NOTE — Telephone Encounter (Signed)
Rx request to pharmacy; Fill on or After Fri, 01.22.16/SLS

## 2014-11-15 NOTE — Telephone Encounter (Signed)
DUPLICATE-there is already an Rx note about this in EMR; too soon for request, being held & pt No Show f/u appointment/SLS

## 2015-01-07 ENCOUNTER — Other Ambulatory Visit: Payer: Self-pay | Admitting: Physician Assistant

## 2015-01-07 NOTE — Telephone Encounter (Signed)
Rx request to pharmacy/SLS  

## 2015-01-20 ENCOUNTER — Ambulatory Visit (INDEPENDENT_AMBULATORY_CARE_PROVIDER_SITE_OTHER): Payer: Commercial Managed Care - PPO | Admitting: Medical

## 2015-01-20 ENCOUNTER — Encounter: Payer: Self-pay | Admitting: Medical

## 2015-01-20 VITALS — BP 103/64 | HR 68 | Temp 98.1°F | Ht 63.5 in | Wt 143.4 lb

## 2015-01-20 DIAGNOSIS — M542 Cervicalgia: Secondary | ICD-10-CM | POA: Diagnosis not present

## 2015-01-20 MED ORDER — CYCLOBENZAPRINE HCL 10 MG PO TABS: 10.0000 mg | ORAL_TABLET | Freq: Every day | ORAL | Status: DC

## 2015-01-20 MED ORDER — HYDROCODONE-ACETAMINOPHEN 5-325 MG PO TABS: 1.0000 | ORAL_TABLET | Freq: Four times a day (QID) | ORAL | Status: DC | PRN

## 2015-01-20 MED ORDER — DICLOFENAC SODIUM 75 MG PO TBEC: 75.0000 mg | DELAYED_RELEASE_TABLET | Freq: Two times a day (BID) | ORAL | Status: DC

## 2015-01-20 NOTE — Assessment & Plan Note (Signed)
Trapezius strain. But hx of disc disease and some procedures done. Rx of diclofenac nsaid. DC meloxicam and otc nsadis. Rx flexeril and limitied rx of norco.  If pain persists or worsens let us know.  By hx may need cspine mri and specialist referral if symptoms persist.  Follow up in 7-10 days or as needed.

## 2015-01-20 NOTE — Progress Notes (Signed)
Pre visit review using our clinic review tool, if applicable. No additional management support is needed unless otherwise documented below in the visit note. 

## 2015-01-20 NOTE — Patient Instructions (Signed)
Neck pain Trapezius strain. But hx of disc disease and some procedures done. Rx of diclofenac nsaid. DC meloxicam and otc nsadis. Rx flexeril and limitied rx of norco.  If pain persists or worsens let us know.  By hx may need cspine mri and specialist referral if symptoms persist.  Follow up in 7-10 days or as needed.

## 2015-01-20 NOTE — Progress Notes (Signed)
Subjective:    Patient ID: Stephanie Spencer, female    DOB: 1961/01/06, 54 y.o.   MRN: [id number]  HPI    Pt in with neck pain. Hx of degenerative changes. Pt states while in [place] she had cortisone injection about 5 yrs ago. Pt states some bulging disk as well. Recent pain for 2 wks. Constant pain. No radicular pain. 8/10 level pain. Pt not taking any medication for pain. Pt was on meloxicam for RA recently but not now. Pt states rheumatologist put her on mobic and humera. No mobic for a few days.    Pt states usual does flare in past about 6 times a yr. But short flares of neck pain x 2-3 days in past. Described as self limitied. No  prednisone recenty.     Review of Systems  Constitutional: Negative for fever, chills, diaphoresis, activity change and fatigue.  Respiratory: Negative for cough, chest tightness and shortness of breath.   Cardiovascular: Negative for chest pain, palpitations and leg swelling.  Gastrointestinal: Negative for nausea, vomiting and abdominal pain.  Musculoskeletal: Positive for neck pain. Negative for neck stiffness.  Neurological: Negative for dizziness, tremors, seizures, syncope, facial asymmetry, speech difficulty, weakness, light-headedness, numbness and headaches.  Psychiatric/Behavioral: Negative for behavioral problems, confusion and agitation. The patient is not nervous/anxious.    Past Medical History  Diagnosis Date  . Chronic rheumatic arthritis   . Hemorrhoids   . Chicken pox   . Mumps   . Hyperlipidemia     Borderline  . Urinary incontinence     Leakage  . UTI (lower urinary tract infection)   . GERD (gastroesophageal reflux disease)   . RLS (restless legs syndrome)     History   Social History  . Marital Status: Married    Spouse Name: N/A  . Number of Children: N/A  . Years of Education: N/A   Occupational History  . Not on file.   Social History Main Topics  . Smoking status: Current Some Day Smoker  . Smokeless tobacco:  Never Used  . Alcohol Use: Not on file     Comment: very rare  . Drug Use: No  . Sexual Activity: Not on file   Other Topics Concern  . Not on file   Social History Narrative    Past Surgical History  Procedure Laterality Date  . Breast enhancement surgery  2005/1992  . Foot surgery      Left, [place]  . Tonsillectomy and adenoidectomy  1972  . Abdominal hysterectomy  2013    Family History  Problem Relation Age of Onset  . Mental illness Mother     Living  . Dementia Mother   . Parkinson's disease Father     Deceased  . Alzheimer's disease Father   . Cancer Maternal Grandmother   . Diabetes Maternal Uncle   . Arthritis Maternal Aunt     Psoriatic   . Alcohol abuse Brother   . Healthy Brother   . Healthy Sister   . Allergies Daughter   . Healthy Daughter   . Healthy Son     x2    Allergies  Allergen Reactions  . Lithium Other (See Comments)    Boils  . Penicillins Hives    Current Outpatient Prescriptions on File Prior to Visit  Medication Sig Dispense Refill  . acyclovir (ZOVIRAX) 800 MG tablet TAKE 1 TABLET BY MOUTH DAILY 45 tablet 1  . ALPRAZolam (XANAX) 0.5 MG tablet Take 0.5 mg by mouth as  needed for anxiety.    . Ascorbic Acid (VITAMIN C) 1000 MG tablet Take 1,000 mg by mouth daily.    . Bacillus Coagulans-Inulin (PROBIOTIC FORMULA) 1-250 BILLION-MG CAPS Take by mouth daily.    . citalopram (CELEXA) 20 MG tablet TAKE 1 TABLET BY MOUTH DAILY 30 tablet 0  . Cranberry-Vitamin C 84-20 MG CAPS Take by mouth daily.    . divalproex (DEPAKOTE) 250 MG DR tablet TAKE 1 TABLET BY MOUTH TWO TO THREE TIMES DAILY 90 tablet 0  . fluconazole (DIFLUCAN) 150 MG tablet Take 1 tablet by mouth.  May repeat course in 4 days. 2 tablet 0  . Multiple Vitamins-Minerals (CENTRUM ADULTS) TABS Take by mouth daily.    Marland Kitchen rOPINIRole (REQUIP) 0.25 MG tablet Take 0.25 mg by mouth as needed (RLS).    . Adalimumab (HUMIRA PEN [place]) Inject into the skin.     . chlorpheniramine-HYDROcodone  (TUSSIONEX) 10-8 MG/5ML LQCR Take 5 mLs by mouth every 12 (twelve) hours as needed for cough. (Patient not taking: Reported on 01/20/2015) 115 mL 0  . doxycycline (MONODOX) 100 MG capsule Take 1 capsule (100 mg total) by mouth 2 (two) times daily. (Patient not taking: Reported on 01/20/2015) 14 capsule 0   No current facility-administered medications on file prior to visit.    BP 103/64 mmHg  Pulse 68  Temp(Src) 98.1 F (36.7 C) (Oral)  Ht 5' 3.5" (1.613 m)  Wt 143 lb 6.4 oz (65.046 kg)  BMI 25.00 kg/m2  SpO2 99%      Objective:   Physical Exam General- No acute distress. Pleasant patient. Neck- Full range of motion, no jvd . Trapezius tender.More rt side on palpation. No mid cspine tenderness. Moderate tender lt side.  Lungs- Clear, even and unlabored. Heart- regular rate and rhythm. Neurologic- CNII- XII grossly intact. Upper ext- equal 5/5 strength.        Assessment & Plan:

## 2015-01-27 ENCOUNTER — Other Ambulatory Visit: Payer: Self-pay | Admitting: Physician Assistant

## 2015-01-28 NOTE — Telephone Encounter (Signed)
Needs OV and repeat Depakote level before further refills.

## 2015-01-28 NOTE — Telephone Encounter (Signed)
Medication Detail      Disp Refills Start End     divalproex (DEPAKOTE) 250 MG DR tablet 90 tablet 0 09/13/2014     Sig: TAKE 1 TABLET BY MOUTH TWO TO THREE TIMES DAILY    E-Prescribing Status: Receipt confirmed by pharmacy (09/13/2014 9:03 AM EST)     Pharmacy    [id number] - [place], [place] - [address]   Please Advise on refills/SLS

## 2015-02-04 ENCOUNTER — Other Ambulatory Visit: Payer: Self-pay | Admitting: Physician Assistant

## 2015-02-11 ENCOUNTER — Other Ambulatory Visit: Payer: Self-pay | Admitting: Physician Assistant

## 2015-02-11 NOTE — Telephone Encounter (Signed)
Medication Detail      Disp Refills Start End     citalopram (CELEXA) 20 MG tablet 30 tablet 0 01/07/2015     Sig: TAKE 1 TABLET BY MOUTH DAILY    E-Prescribing Status: Receipt confirmed by pharmacy (01/07/2015 8:12 AM EST)   Pharmacy    [id number] - [place], [place] - [address]     Rx request to pharmacy for 30-day Only per Provider/SLS Please call patient and arrange F/U appointment prior to future refill authorizations/SLS Thanks.

## 2015-02-17 NOTE — Telephone Encounter (Signed)
Pt scheduled follow up for 03/04/15

## 2015-02-22 ENCOUNTER — Ambulatory Visit (INDEPENDENT_AMBULATORY_CARE_PROVIDER_SITE_OTHER): Payer: Commercial Managed Care - PPO | Admitting: Physician Assistant

## 2015-02-22 ENCOUNTER — Encounter: Payer: Self-pay | Admitting: Physician Assistant

## 2015-02-22 VITALS — BP 110/68 | HR 61 | Temp 98.1°F | Resp 16 | Ht 63.5 in | Wt 142.0 lb

## 2015-02-22 DIAGNOSIS — K2970 Gastritis, unspecified, without bleeding: Secondary | ICD-10-CM

## 2015-02-22 MED ORDER — ONDANSETRON 8 MG PO TBDP: 8.0000 mg | ORAL_TABLET | Freq: Three times a day (TID) | ORAL | Status: DC | PRN

## 2015-02-22 NOTE — Progress Notes (Signed)
Pre visit review using our clinic review tool, if applicable. No additional management support is needed unless otherwise documented below in the visit note. 

## 2015-02-22 NOTE — Patient Instructions (Signed)
Please go to lab for blood work. I will call you with your results.  Please take an over-the-counter Zantac twice daily over the next 2-3 days. Take zofran as directed if needed for nausea. Follow diet below.   Food Choices to Help Relieve Diarrhea When you have diarrhea, the foods you eat and your eating habits are very important. Choosing the right foods and drinks can help relieve diarrhea. Also, because diarrhea can last up to 7 days, you need to replace lost fluids and electrolytes (such as sodium, potassium, and chloride) in order to help prevent dehydration.  WHAT GENERAL GUIDELINES DO I NEED TO FOLLOW?  Slowly drink 1 cup (8 oz) of fluid for each episode of diarrhea. If you are getting enough fluid, your urine will be clear or pale yellow.  Eat starchy foods. Some good choices include white rice, white toast, pasta, low-fiber cereal, baked potatoes (without the skin), saltine crackers, and bagels.  Avoid large servings of any cooked vegetables.  Limit fruit to two servings per day. A serving is  cup or 1 small piece.  Choose foods with less than 2 g of fiber per serving.  Limit fats to less than 8 tsp (38 g) per day.  Avoid fried foods.  Eat foods that have probiotics in them. Probiotics can be found in certain dairy products.  Avoid foods and beverages that may increase the speed at which food moves through the stomach and intestines (gastrointestinal tract). Things to avoid include:  High-fiber foods, such as dried fruit, raw fruits and vegetables, nuts, seeds, and whole grain foods.  Spicy foods and high-fat foods.  Foods and beverages sweetened with high-fructose corn syrup, honey, or sugar alcohols such as xylitol, sorbitol, and mannitol. WHAT FOODS ARE RECOMMENDED? Grains White rice. White, [place], or pita breads (fresh or toasted), including plain rolls, buns, or bagels. White pasta. Saltine, soda, or graham crackers. Pretzels. Low-fiber cereal. Cooked cereals made  with water (such as cornmeal, farina, or cream cereals). Plain muffins. Matzo. Melba toast. [place].  Vegetables Potatoes (without the skin). Strained tomato and vegetable juices. Most well-cooked and canned vegetables without seeds. Tender lettuce. Fruits Cooked or canned applesauce, apricots, cherries, fruit cocktail, grapefruit, peaches, pears, or plums. Fresh bananas, apples without skin, cherries, grapes, cantaloupe, grapefruit, peaches, oranges, or plums.  Meat and Other Protein Products Baked or boiled chicken. Eggs. Tofu. Fish. Seafood. Smooth peanut butter. Ground or well-cooked tender beef, ham, veal, lamb, pork, or poultry.  Dairy Plain yogurt, kefir, and unsweetened liquid yogurt. Lactose-free milk, buttermilk, or soy milk. Plain hard cheese. Beverages Sport drinks. Clear broths. Diluted fruit juices (except prune). Regular, caffeine-free sodas such as ginger ale. Water. Decaffeinated teas. Oral rehydration solutions. Sugar-free beverages not sweetened with sugar alcohols. Other Bouillon, broth, or soups made from recommended foods.  The items listed above may not be a complete list of recommended foods or beverages. Contact your dietitian for more options. WHAT FOODS ARE NOT RECOMMENDED? Grains Whole grain, whole wheat, bran, or rye breads, rolls, pastas, crackers, and cereals. Wild or brown rice. Cereals that contain more than 2 g of fiber per serving. Corn tortillas or taco shells. Cooked or dry oatmeal. Granola. Popcorn. Vegetables Raw vegetables. Cabbage, broccoli, Brussels sprouts, artichokes, baked beans, beet greens, corn, kale, legumes, peas, sweet potatoes, and yams. Potato skins. Cooked spinach and cabbage. Fruits Dried fruit, including raisins and dates. Raw fruits. Stewed or dried prunes. Fresh apples with skin, apricots, mangoes, pears, raspberries, and strawberries.  Meat and Other Protein  Products Chunky peanut butter. Nuts and seeds. Beans and lentils. Tomasa Blase.    Dairy High-fat cheeses. Milk, chocolate milk, and beverages made with milk, such as milk shakes. Cream. Ice cream. Sweets and Desserts Sweet rolls, doughnuts, and sweet breads. Pancakes and waffles. Fats and Oils Butter. Cream sauces. Margarine. Salad oils. Plain salad dressings. Olives. Avocados.  Beverages Caffeinated beverages (such as coffee, tea, soda, or energy drinks). Alcoholic beverages. Fruit juices with pulp. Prune juice. Soft drinks sweetened with high-fructose corn syrup or sugar alcohols. Other Coconut. Hot sauce. Chili powder. Mayonnaise. Gravy. Cream-based or milk-based soups.  The items listed above may not be a complete list of foods and beverages to avoid. Contact your dietitian for more information. WHAT SHOULD I DO IF I BECOME DEHYDRATED? Diarrhea can sometimes lead to dehydration. Signs of dehydration include dark urine and dry mouth and skin. If you think you are dehydrated, you should rehydrate with an oral rehydration solution. These solutions can be purchased at pharmacies, retail stores, or online.  Drink -1 cup (120-240 mL) of oral rehydration solution each time you have an episode of diarrhea. If drinking this amount makes your diarrhea worse, try drinking smaller amounts more often. For example, drink 1-3 tsp (5-15 mL) every 5-10 minutes.  A general rule for staying hydrated is to drink 1-2 L of fluid per day. Talk to your health care provider about the specific amount you should be drinking each day. Drink enough fluids to keep your urine clear or pale yellow. Document Released: 01/05/2004 Document Revised: 10/20/2013 Document Reviewed: 09/07/2013 [facility] Patient Information 2015 [place], [place]. This information is not intended to replace advice given to you by your health care provider. Make sure you discuss any questions you have with your health care provider.

## 2015-02-22 NOTE — Assessment & Plan Note (Signed)
Zantac OTC twice daily.  Diet for diarrhea given.  Rx Zofran for nausea. Increased hydration. Will check CBC, CMP, Lipase and H. Pylori. Supportive measures discussed.  Follow-up if no improvement.

## 2015-02-22 NOTE — Progress Notes (Signed)
Patient presents to clinic today c/o 4 days nausea and hunger pains in abdomen.  Denies emesis.  Denies fever, chills. Endorses loose stools without hematochezia or melena. Denies urinary urgency, frequency, dysuria or hematuria.  Endorses low back pain starting this morning that is bilateral and worse with movement.  Denies trauma or injury. States back pain is improving.  Past Medical History  Diagnosis Date  . Chronic rheumatic arthritis   . Hemorrhoids   . Chicken pox   . Mumps   . Hyperlipidemia     Borderline  . Urinary incontinence     Leakage  . UTI (lower urinary tract infection)   . GERD (gastroesophageal reflux disease)   . RLS (restless legs syndrome)     Current Outpatient Prescriptions on File Prior to Visit  Medication Sig Dispense Refill  . acyclovir (ZOVIRAX) 800 MG tablet TAKE 1 TABLET BY MOUTH DAILY 45 tablet 1  . ALPRAZolam (XANAX) 0.5 MG tablet Take 0.5 mg by mouth as needed for anxiety.    . Ascorbic Acid (VITAMIN C) 1000 MG tablet Take 1,000 mg by mouth daily.    . Bacillus Coagulans-Inulin (PROBIOTIC FORMULA) 1-250 BILLION-MG CAPS Take by mouth daily.    . citalopram (CELEXA) 20 MG tablet TAKE 1 TABLET BY MOUTH DAILY 30 tablet 0  . Cranberry-Vitamin C 84-20 MG CAPS Take by mouth daily.    . diclofenac (VOLTAREN) 75 MG EC tablet Take 1 tablet (75 mg total) by mouth 2 (two) times daily. 30 tablet 0  . divalproex (DEPAKOTE) 250 MG DR tablet TAKE 1 TABLET BY MOUTH TWO TO THREE TIMES DAILY 30 tablet 0  . Multiple Vitamins-Minerals (CENTRUM ADULTS) TABS Take by mouth daily.    . Adalimumab (HUMIRA PEN [place]) Inject into the skin.     . cyclobenzaprine (FLEXERIL) 10 MG tablet Take 1 tablet (10 mg total) by mouth at bedtime. (Patient not taking: Reported on 02/22/2015) 10 tablet 0  . rOPINIRole (REQUIP) 0.25 MG tablet Take 0.25 mg by mouth as needed (RLS).     No current facility-administered medications on file prior to visit.    Allergies  Allergen Reactions    . Lithium Other (See Comments)    Boils  . Penicillins Hives    Family History  Problem Relation Age of Onset  . Mental illness Mother     Living  . Dementia Mother   . Parkinson's disease Father     Deceased  . Alzheimer's disease Father   . Cancer Maternal Grandmother   . Diabetes Maternal Uncle   . Arthritis Maternal Aunt     Psoriatic   . Alcohol abuse Brother   . Healthy Brother   . Healthy Sister   . Allergies Daughter   . Healthy Daughter   . Healthy Son     x2    History   Social History  . Marital Status: Married    Spouse Name: N/A  . Number of Children: N/A  . Years of Education: N/A   Social History Main Topics  . Smoking status: Current Some Day Smoker  . Smokeless tobacco: Never Used  . Alcohol Use: Not on file     Comment: very rare  . Drug Use: No  . Sexual Activity: Not on file   Other Topics Concern  . None   Social History Narrative   Review of Systems - See HPI.  All other ROS are negative.  BP 110/68 mmHg  Pulse 61  Temp(Src) 98.1 F (  36.7 C) (Oral)  Resp 16  Ht 5' 3.5" (1.613 m)  Wt 142 lb (64.411 kg)  BMI 24.76 kg/m2  SpO2 97%  Physical Exam  Constitutional: She is oriented to person, place, and time and well-developed, well-nourished, and in no distress.  HENT:  Head: Normocephalic and atraumatic.  Eyes: Conjunctivae are normal.  Cardiovascular: Normal rate, regular rhythm, normal heart sounds and intact distal pulses.   Pulmonary/Chest: Effort normal and breath sounds normal. No respiratory distress. She has no wheezes. She has no rales. She exhibits no tenderness.  Abdominal: Bowel sounds are hyperactive. There is no hepatosplenomegaly. There is tenderness in the epigastric area and left upper quadrant. There is no CVA tenderness and no tenderness at McBurney's point. No hernia.  Musculoskeletal:       Lumbar back: She exhibits tenderness. She exhibits no bony tenderness and no spasm.  Neurological: She is alert and  oriented to person, place, and time.  Skin: Skin is warm and dry. No rash noted.  Psychiatric: Affect normal.  Vitals reviewed.   No results found for this or any previous visit (from the past 2160 hour(s)).  Assessment/Plan: Viral gastritis Zantac OTC twice daily.  Diet for diarrhea given.  Rx Zofran for nausea. Increased hydration. Will check CBC, CMP, Lipase and H. Pylori. Supportive measures discussed.  Follow-up if no improvement.

## 2015-02-23 ENCOUNTER — Telehealth: Payer: Self-pay | Admitting: Physician Assistant

## 2015-02-23 LAB — CBC WITH DIFFERENTIAL/PLATELET
Basophils Absolute: 0 10*3/uL (ref 0.0–0.1)
Basophils Relative: 0.7 % (ref 0.0–3.0)
Eosinophils Absolute: 0.1 10*3/uL (ref 0.0–0.7)
Eosinophils Relative: 1.6 % (ref 0.0–5.0)
HEMATOCRIT: 37.1 % (ref 36.0–46.0)
HEMOGLOBIN: 12.7 g/dL (ref 12.0–15.0)
Lymphocytes Relative: 44.8 % (ref 12.0–46.0)
Lymphs Abs: 2 10*3/uL (ref 0.7–4.0)
MCHC: 34.3 g/dL (ref 30.0–36.0)
MCV: 91.7 fl (ref 78.0–100.0)
Monocytes Absolute: 0.3 10*3/uL (ref 0.1–1.0)
Monocytes Relative: 7.7 % (ref 3.0–12.0)
Neutro Abs: 2 10*3/uL (ref 1.4–7.7)
Neutrophils Relative %: 45.2 % (ref 43.0–77.0)
Platelets: 181 10*3/uL (ref 150.0–400.0)
RBC: 4.05 Mil/uL (ref 3.87–5.11)
RDW: 13.1 % (ref 11.5–15.5)
WBC: 4.5 10*3/uL (ref 4.0–10.5)

## 2015-02-23 LAB — COMPREHENSIVE METABOLIC PANEL
ALT: 20 U/L (ref 0–35)
AST: 17 U/L (ref 0–37)
Albumin: 4.3 g/dL (ref 3.5–5.2)
Alkaline Phosphatase: 30 U/L — ABNORMAL LOW (ref 39–117)
BILIRUBIN TOTAL: 0.3 mg/dL (ref 0.2–1.2)
BUN: 13 mg/dL (ref 6–23)
CO2: 32 mEq/L (ref 19–32)
Calcium: 9.6 mg/dL (ref 8.4–10.5)
Chloride: 102 mEq/L (ref 96–112)
Creatinine, Ser: 1.27 mg/dL — ABNORMAL HIGH (ref 0.40–1.20)
GFR: 46.61 mL/min — ABNORMAL LOW (ref >60.00)
Glucose, Bld: 77 mg/dL (ref 70–99)
Potassium: 3.9 mEq/L (ref 3.5–5.1)
Sodium: 139 mEq/L (ref 135–145)
Total Protein: 7 g/dL (ref 6.0–8.3)

## 2015-02-23 LAB — H. PYLORI ANTIBODY, IGG: H PYLORI IGG: NEGATIVE

## 2015-02-23 LAB — LIPASE: Lipase: 18 U/L (ref 11.0–59.0)

## 2015-02-23 NOTE — Telephone Encounter (Signed)
-----   Message from Waldon Merl, PA-C sent at 02/23/2015 12:45 PM EDT ----- Labs good overall.  Continue care discussed at visit.  Symptoms should improve over the next few days.

## 2015-02-23 NOTE — Telephone Encounter (Signed)
See result note. Please call patient. 

## 2015-02-23 NOTE — Telephone Encounter (Signed)
Relation to pt: self  Call back number: [phone]   Reason for call:  Pt requesting lab results from 02/22/15. Pt can not answer the phone after 2:30pm would like a call before then. Please advise

## 2015-02-23 NOTE — Telephone Encounter (Signed)
Informed of all results states she's not feeling any better. Verbalized understanding.

## 2015-02-28 ENCOUNTER — Other Ambulatory Visit: Payer: Self-pay | Admitting: Physician Assistant

## 2015-03-04 ENCOUNTER — Encounter: Payer: Self-pay | Admitting: Physician Assistant

## 2015-03-04 ENCOUNTER — Ambulatory Visit (INDEPENDENT_AMBULATORY_CARE_PROVIDER_SITE_OTHER): Payer: Commercial Managed Care - PPO | Admitting: Physician Assistant

## 2015-03-04 VITALS — BP 105/70 | HR 60 | Temp 97.9°F | Wt 145.0 lb

## 2015-03-04 DIAGNOSIS — F3161 Bipolar disorder, current episode mixed, mild: Secondary | ICD-10-CM

## 2015-03-04 DIAGNOSIS — M542 Cervicalgia: Secondary | ICD-10-CM

## 2015-03-04 MED ORDER — DICLOFENAC SODIUM 75 MG PO TBEC: 75.0000 mg | DELAYED_RELEASE_TABLET | Freq: Two times a day (BID) | ORAL | Status: DC

## 2015-03-04 MED ORDER — CITALOPRAM HYDROBROMIDE 20 MG PO TABS: 20.0000 mg | ORAL_TABLET | Freq: Every day | ORAL | Status: DC

## 2015-03-04 MED ORDER — ACYCLOVIR 800 MG PO TABS: 800.0000 mg | ORAL_TABLET | Freq: Every day | ORAL | Status: DC

## 2015-03-04 MED ORDER — DIVALPROEX SODIUM 250 MG PO DR TAB: DELAYED_RELEASE_TABLET | ORAL | Status: DC

## 2015-03-04 NOTE — Progress Notes (Signed)
Pre visit review using our clinic review tool, if applicable. No additional management support is needed unless otherwise documented below in the visit note. 

## 2015-03-04 NOTE — Assessment & Plan Note (Signed)
Stable. Medications refilled. Will check Depakote level today.

## 2015-03-04 NOTE — Assessment & Plan Note (Signed)
Chronic. Previously requiring injections.  Will refer to [facility] for further assessment. Voltaren refilled. Continue topical preparations.

## 2015-03-04 NOTE — Patient Instructions (Signed)
Please go to the lab for blood work. I will call you with your results. You will be contacted by specialist for appointment.

## 2015-03-04 NOTE — Progress Notes (Signed)
Patient presents to clinic today for follow-up of bipolar disorder, currently on Depakote and Celexa.  Previously doing well on regimen, endorses continued control of symptoms with medication. Denies fluctuating moods or manic episodes. Denies SI/HI. Is due for repeat Depakote level.  Patient complains of worsening chronic neck pain over the past several months.  Was previously seen by Orthopedic Surgery in [place]. Endorses last MRI several years ago.  Pain radiates into shoulders but not into arms.  Denies numbness, tingling or weakness.  Past Medical History  Diagnosis Date  . Chronic rheumatic arthritis   . Hemorrhoids   . Chicken pox   . Mumps   . Hyperlipidemia     Borderline  . Urinary incontinence     Leakage  . UTI (lower urinary tract infection)   . GERD (gastroesophageal reflux disease)   . RLS (restless legs syndrome)     Current Outpatient Prescriptions on File Prior to Visit  Medication Sig Dispense Refill  . Adalimumab (HUMIRA PEN [place]) Inject into the skin.     Marland Kitchen ALPRAZolam (XANAX) 0.5 MG tablet Take 0.5 mg by mouth as needed for anxiety.    . Ascorbic Acid (VITAMIN C) 1000 MG tablet Take 1,000 mg by mouth daily.    . Bacillus Coagulans-Inulin (PROBIOTIC FORMULA) 1-250 BILLION-MG CAPS Take by mouth daily.    . Cranberry-Vitamin C 84-20 MG CAPS Take by mouth daily.    . Multiple Vitamins-Minerals (CENTRUM ADULTS) TABS Take by mouth daily.    Marland Kitchen rOPINIRole (REQUIP) 0.25 MG tablet Take 0.25 mg by mouth as needed (RLS).     No current facility-administered medications on file prior to visit.    Allergies  Allergen Reactions  . Lithium Other (See Comments)    Boils  . Penicillins Hives    Family History  Problem Relation Age of Onset  . Mental illness Mother     Living  . Dementia Mother   . Parkinson's disease Father     Deceased  . Alzheimer's disease Father   . Cancer Maternal Grandmother   . Diabetes Maternal Uncle   . Arthritis Maternal Aunt    Psoriatic   . Alcohol abuse Brother   . Healthy Brother   . Healthy Sister   . Allergies Daughter   . Healthy Daughter   . Healthy Son     x2    History   Social History  . Marital Status: Married    Spouse Name: N/A  . Number of Children: N/A  . Years of Education: N/A   Social History Main Topics  . Smoking status: Current Some Day Smoker  . Smokeless tobacco: Never Used  . Alcohol Use: Not on file     Comment: very rare  . Drug Use: No  . Sexual Activity: Not on file   Other Topics Concern  . None   Social History Narrative   Review of Systems - See HPI.  All other ROS are negative.  BP 105/70 mmHg  Pulse 60  Temp(Src) 97.9 F (36.6 C)  Wt 145 lb (65.772 kg)  SpO2 95%  Physical Exam  Constitutional: She is oriented to person, place, and time and well-developed, well-nourished, and in no distress.  HENT:  Head: Normocephalic and atraumatic.  Cardiovascular: Normal rate, regular rhythm, normal heart sounds and intact distal pulses.   Pulmonary/Chest: Effort normal and breath sounds normal. No respiratory distress. She has no wheezes. She has no rales. She exhibits no tenderness.  Musculoskeletal:  Cervical back: She exhibits tenderness. She exhibits normal range of motion and no bony tenderness.  Neurological: She is alert and oriented to person, place, and time.  Skin: Skin is warm and dry. No rash noted.  Psychiatric: Affect normal.  Vitals reviewed.  Recent Results (from the past 2160 hour(s))  CBC w/Diff     Status: None   Collection Time: 02/22/15  4:07 PM  Result Value Ref Range   WBC 4.5 4.0 - 10.5 K/uL   RBC 4.05 3.87 - 5.11 Mil/uL   Hemoglobin 12.7 12.0 - 15.0 g/dL   HCT 37.1 36.0 - 46.0 %   MCV 91.7 78.0 - 100.0 fl   MCHC 34.3 30.0 - 36.0 g/dL   RDW 13.1 11.5 - 15.5 %   Platelets 181.0 150.0 - 400.0 K/uL   Neutrophils Relative % 45.2 43.0 - 77.0 %   Lymphocytes Relative 44.8 12.0 - 46.0 %   Monocytes Relative 7.7 3.0 - 12.0 %    Eosinophils Relative 1.6 0.0 - 5.0 %   Basophils Relative 0.7 0.0 - 3.0 %   Neutro Abs 2.0 1.4 - 7.7 K/uL   Lymphs Abs 2.0 0.7 - 4.0 K/uL   Monocytes Absolute 0.3 0.1 - 1.0 K/uL   Eosinophils Absolute 0.1 0.0 - 0.7 K/uL   Basophils Absolute 0.0 0.0 - 0.1 K/uL  Comp Met (CMET)     Status: Abnormal   Collection Time: 02/22/15  4:07 PM  Result Value Ref Range   Sodium 139 135 - 145 mEq/L   Potassium 3.9 3.5 - 5.1 mEq/L   Chloride 102 96 - 112 mEq/L   CO2 32 19 - 32 mEq/L   Glucose, Bld 77 70 - 99 mg/dL   BUN 13 6 - 23 mg/dL   Creatinine, Ser 1.27 (H) 0.40 - 1.20 mg/dL   Total Bilirubin 0.3 0.2 - 1.2 mg/dL   Alkaline Phosphatase 30 (L) 39 - 117 U/L   AST 17 0 - 37 U/L   ALT 20 0 - 35 U/L   Total Protein 7.0 6.0 - 8.3 g/dL   Albumin 4.3 3.5 - 5.2 g/dL   Calcium 9.6 8.4 - 10.5 mg/dL   GFR 46.61 (L) >60.00 mL/min  Lipase     Status: None   Collection Time: 02/22/15  4:07 PM  Result Value Ref Range   Lipase 18.0 11.0 - 59.0 U/L  H. pylori antibody, IgG     Status: None   Collection Time: 02/22/15  4:07 PM  Result Value Ref Range   H Pylori IgG Negative Negative   Assessment/Plan: Neck pain Chronic. Previously requiring injections.  Will refer to [facility] for [facility]. Voltaren refilled. Continue topical preparations.   Bipolar disorder Stable. Medications refilled. Will check Depakote level today.

## 2015-03-05 LAB — VALPROIC ACID LEVEL: Valproic Acid Lvl: 34.1 ug/mL — ABNORMAL LOW (ref 50.0–100.0)

## 2015-04-05 ENCOUNTER — Other Ambulatory Visit: Payer: Self-pay | Admitting: Physician Assistant

## 2015-05-11 ENCOUNTER — Encounter: Payer: Self-pay | Admitting: Physician Assistant

## 2015-05-11 ENCOUNTER — Other Ambulatory Visit: Payer: Self-pay | Admitting: Physician Assistant

## 2015-05-11 ENCOUNTER — Ambulatory Visit (INDEPENDENT_AMBULATORY_CARE_PROVIDER_SITE_OTHER): Payer: Commercial Managed Care - PPO | Admitting: Physician Assistant

## 2015-05-11 VITALS — BP 100/57 | HR 66 | Temp 98.0°F | Ht 63.5 in | Wt 147.4 lb

## 2015-05-11 DIAGNOSIS — M542 Cervicalgia: Principal | ICD-10-CM

## 2015-05-11 DIAGNOSIS — G8929 Other chronic pain: Secondary | ICD-10-CM

## 2015-05-11 DIAGNOSIS — Z719 Counseling, unspecified: Secondary | ICD-10-CM

## 2015-05-11 DIAGNOSIS — Z1239 Encounter for other screening for malignant neoplasm of breast: Secondary | ICD-10-CM

## 2015-05-11 NOTE — Assessment & Plan Note (Signed)
Evident need for patient to be out of work to help set up care for her mother with progressive dementia. FMLA filled out in office and faxed to [facility].

## 2015-05-11 NOTE — Progress Notes (Signed)
  Patient presents to clinic today to discuss FMLA paperwork. Patient's mother diagnosed with dementia one year ago that has rapidly progresses. Mother lives in [place] and patient is needing to go out to mother's home for a period of 3 weeks to help take care of mother ans set up further care for her. Work is giving her a hard time regarding this and her HR representative told her she should seek FMLA as a caregiver.  Past Medical History  Diagnosis Date  . Chronic rheumatic arthritis   . Hemorrhoids   . Chicken pox   . Mumps   . Hyperlipidemia     Borderline  . Urinary incontinence     Leakage  . UTI (lower urinary tract infection)   . GERD (gastroesophageal reflux disease)   . RLS (restless legs syndrome)     Current Outpatient Prescriptions on File Prior to Visit  Medication Sig Dispense Refill  . acyclovir (ZOVIRAX) 800 MG tablet Take 1 tablet (800 mg total) by mouth daily. 45 tablet 1  . Adalimumab (HUMIRA PEN [place]) Inject into the skin.     . ALPRAZolam (XANAX) 0.5 MG tablet Take 0.5 mg by mouth as needed for anxiety.    . Ascorbic Acid (VITAMIN C) 1000 MG tablet Take 1,000 mg by mouth daily.    . Bacillus Coagulans-Inulin (PROBIOTIC FORMULA) 1-250 BILLION-MG CAPS Take by mouth daily.    . citalopram (CELEXA) 20 MG tablet Take 1 tablet (20 mg total) by mouth daily. 90 tablet 1  . Cranberry-Vitamin C 84-20 MG CAPS Take by mouth daily.    . diclofenac (VOLTAREN) 75 MG EC tablet Take 1 tablet (75 mg total) by mouth 2 (two) times daily. 30 tablet 0  . divalproex (DEPAKOTE) 250 MG DR tablet TAKE 1 TABLET BY MOUTH TWO TO THREE TIMES DAILY. 270 tablet 1  . divalproex (DEPAKOTE) 250 MG DR tablet TAKE 1 TABLET BY MOUTH TWICE DAILY TO THREE TIMES DAILY 90 tablet 1  . Multiple Vitamins-Minerals (CENTRUM ADULTS) TABS Take by mouth daily.    . rOPINIRole (REQUIP) 0.25 MG tablet Take 0.25 mg by mouth as needed (RLS).     No current facility-administered medications on file prior to visit.     Allergies  Allergen Reactions  . Lithium Other (See Comments)    Boils  . Penicillins Hives    Family History  Problem Relation Age of Onset  . Mental illness Mother     Living  . Dementia Mother   . Parkinson's disease Father     Deceased  . Alzheimer's disease Father   . Cancer Maternal Grandmother   . Diabetes Maternal Uncle   . Arthritis Maternal Aunt     Psoriatic   . Alcohol abuse Brother   . Healthy Brother   . Healthy Sister   . Allergies Daughter   . Healthy Daughter   . Healthy Son     x2    History   Social History  . Marital Status: Married    Spouse Name: N/A  . Number of Children: N/A  . Years of Education: N/A   Social History Main Topics  . Smoking status: Current Some Day Smoker  . Smokeless tobacco: Never Used  . Alcohol Use: Not on file     Comment: very rare  . Drug Use: No  . Sexual Activity: Not on file   Other Topics Concern  . None   Social History Narrative    Review of Systems - See   HPI.  All other ROS are negative.  BP 100/57 mmHg  Pulse 66  Temp(Src) 98 F (36.7 C) (Oral)  Ht 5' 3.5" (1.613 m)  Wt 147 lb 6.4 oz (66.86 kg)  BMI 25.70 kg/m2  SpO2 98%  Physical Exam None performed as this was a consult regarding FMLA.  Recent Results (from the past 2160 hour(s))  CBC w/Diff     Status: None   Collection Time: 02/22/15  4:07 PM  Result Value Ref Range   WBC 4.5 4.0 - 10.5 K/uL   RBC 4.05 3.87 - 5.11 Mil/uL   Hemoglobin 12.7 12.0 - 15.0 g/dL   HCT 37.1 36.0 - 46.0 %   MCV 91.7 78.0 - 100.0 fl   MCHC 34.3 30.0 - 36.0 g/dL   RDW 13.1 11.5 - 15.5 %   Platelets 181.0 150.0 - 400.0 K/uL   Neutrophils Relative % 45.2 43.0 - 77.0 %   Lymphocytes Relative 44.8 12.0 - 46.0 %   Monocytes Relative 7.7 3.0 - 12.0 %   Eosinophils Relative 1.6 0.0 - 5.0 %   Basophils Relative 0.7 0.0 - 3.0 %   Neutro Abs 2.0 1.4 - 7.7 K/uL   Lymphs Abs 2.0 0.7 - 4.0 K/uL   Monocytes Absolute 0.3 0.1 - 1.0 K/uL   Eosinophils Absolute  0.1 0.0 - 0.7 K/uL   Basophils Absolute 0.0 0.0 - 0.1 K/uL  Comp Met (CMET)     Status: Abnormal   Collection Time: 02/22/15  4:07 PM  Result Value Ref Range   Sodium 139 135 - 145 mEq/L   Potassium 3.9 3.5 - 5.1 mEq/L   Chloride 102 96 - 112 mEq/L   CO2 32 19 - 32 mEq/L   Glucose, Bld 77 70 - 99 mg/dL   BUN 13 6 - 23 mg/dL   Creatinine, Ser 1.27 (H) 0.40 - 1.20 mg/dL   Total Bilirubin 0.3 0.2 - 1.2 mg/dL   Alkaline Phosphatase 30 (L) 39 - 117 U/L   AST 17 0 - 37 U/L   ALT 20 0 - 35 U/L   Total Protein 7.0 6.0 - 8.3 g/dL   Albumin 4.3 3.5 - 5.2 g/dL   Calcium 9.6 8.4 - 10.5 mg/dL   GFR 46.61 (L) >60.00 mL/min  Lipase     Status: None   Collection Time: 02/22/15  4:07 PM  Result Value Ref Range   Lipase 18.0 11.0 - 59.0 U/L  H. pylori antibody, IgG     Status: None   Collection Time: 02/22/15  4:07 PM  Result Value Ref Range   H Pylori IgG Negative Negative  Valproic Acid level     Status: Abnormal   Collection Time: 03/04/15  1:36 PM  Result Value Ref Range   Valproic Acid Lvl 34.1 (L) 50.0 - 100.0 ug/mL    Assessment/Plan: No problem-specific assessment & plan notes found for this encounter.

## 2015-05-11 NOTE — Progress Notes (Signed)
Pre visit review using our clinic review tool, if applicable. No additional management support is needed unless otherwise documented below in the visit note. 

## 2015-06-10 ENCOUNTER — Other Ambulatory Visit: Payer: Self-pay | Admitting: Physician Assistant

## 2015-07-05 ENCOUNTER — Telehealth: Payer: Self-pay | Admitting: Physician Assistant

## 2015-07-05 NOTE — Telephone Encounter (Signed)
Relation to SW:HQPR  Call back number:[phone] Pharmacy:  Reason for call:  Patient requesting a refill divalproex (DEPAKOTE) 250 MG DR tablet and citalopram (CELEXA) 20 MG tablet. Patient requesting orders for [facility] (patient lost Rx) and concoscopy

## 2015-07-05 NOTE — Telephone Encounter (Signed)
She has refills available as 3 month supply with refills given in May.  Should last to November. She needs to call pharmacy.

## 2015-07-05 NOTE — Telephone Encounter (Signed)
Medication Detail      Disp Refills Start End     citalopram (CELEXA) 20 MG tablet 90 tablet 1 03/04/2015     Sig - Route: Take 1 tablet (20 mg total) by mouth daily. - Oral    Notes to Pharmacy: D/C PREVIOUS SCRIPTS FOR THIS MEDICATION    E-Prescribing Status: Receipt confirmed by pharmacy (03/04/2015 1:20 PM EDT)   Pharmacy    [id number] - [place], [place] - [address]   Medication Detail      Disp Refills Start End     divalproex (DEPAKOTE) 250 MG DR tablet 270 tablet 1 03/04/2015     Sig: TAKE 1 TABLET BY MOUTH TWO TO THREE TIMES DAILY.    E-Prescribing Status: Receipt confirmed by pharmacy (03/04/2015 1:20 PM EDT)     Pharmacy    [id number] - [place],  - [address]   LMOM with contact name and number RE: requested refills after speaking with pharmacy, pt had available refills and she can pick them up at her convenience/SLS

## 2015-08-30 ENCOUNTER — Other Ambulatory Visit: Payer: Self-pay | Admitting: Physician Assistant

## 2015-09-02 ENCOUNTER — Encounter: Payer: Self-pay | Admitting: Internal Medicine

## 2015-09-02 ENCOUNTER — Ambulatory Visit (INDEPENDENT_AMBULATORY_CARE_PROVIDER_SITE_OTHER): Payer: Commercial Managed Care - PPO | Admitting: Physician Assistant

## 2015-09-02 ENCOUNTER — Encounter: Payer: Self-pay | Admitting: Physician Assistant

## 2015-09-02 ENCOUNTER — Telehealth: Payer: Self-pay | Admitting: *Deleted

## 2015-09-02 VITALS — BP 95/62 | HR 72 | Temp 98.7°F | Resp 16 | Ht 64.0 in | Wt 156.5 lb

## 2015-09-02 DIAGNOSIS — Z1211 Encounter for screening for malignant neoplasm of colon: Secondary | ICD-10-CM | POA: Insufficient documentation

## 2015-09-02 DIAGNOSIS — Z1239 Encounter for other screening for malignant neoplasm of breast: Secondary | ICD-10-CM | POA: Insufficient documentation

## 2015-09-02 DIAGNOSIS — F3161 Bipolar disorder, current episode mixed, mild: Secondary | ICD-10-CM

## 2015-09-02 MED ORDER — LURASIDONE HCL 20 MG PO TABS: 20.0000 mg | ORAL_TABLET | Freq: Every day | ORAL | Status: DC

## 2015-09-02 MED ORDER — DICLOFENAC SODIUM 1 % TD GEL: 2.0000 g | Freq: Four times a day (QID) | TRANSDERMAL | Status: DC

## 2015-09-02 NOTE — Progress Notes (Signed)
Pre visit review using our clinic review tool, if applicable. No additional management support is needed unless otherwise documented below in the visit note/SLS  

## 2015-09-02 NOTE — Assessment & Plan Note (Signed)
Will continue the Depakote. Will add-on Latuda 20 mg daily. Will follow-up in 1 month.

## 2015-09-02 NOTE — Telephone Encounter (Signed)
Received PA for Latuda from pt's pharmacy; processed via Cover My Meds electronically, awaiting decision from [employer]. LMOM with contact name and number [for return call, if needed] RE: PA and that we would contact her & pharmacy when we receive Insurance Approval/SLS

## 2015-09-02 NOTE — Progress Notes (Signed)
Patient presents to clinic today for 4-month follow-up Bipolar Disorder.  Is taking her Depakote as directed. Was previously working well for her but has recently had some breakthrough irritability and significant mood swings. Denies SI/HI.  Patient also ready to have her mammogram and screening colonoscopy.   Past Medical History  Diagnosis Date  . Chronic rheumatic arthritis (HCC)   . Hemorrhoids   . Chicken pox   . Mumps   . Hyperlipidemia     Borderline  . Urinary incontinence     Leakage  . UTI (lower urinary tract infection)   . GERD (gastroesophageal reflux disease)   . RLS (restless legs syndrome)     Current Outpatient Prescriptions on File Prior to Visit  Medication Sig Dispense Refill  . acyclovir (ZOVIRAX) 800 MG tablet TAKE 1 TABLET(800 MG) BY MOUTH DAILY 45 tablet 5  . ALPRAZolam (XANAX) 0.5 MG tablet Take 0.5 mg by mouth as needed for anxiety.    . Ascorbic Acid (VITAMIN C) 1000 MG tablet Take 1,000 mg by mouth daily.    . Bacillus Coagulans-Inulin (PROBIOTIC FORMULA) 1-250 BILLION-MG CAPS Take by mouth daily.    . citalopram (CELEXA) 20 MG tablet TAKE 1 TABLET BY MOUTH DAILY 90 tablet 1  . Cranberry-Vitamin C 84-20 MG CAPS Take by mouth daily.    . divalproex (DEPAKOTE) 250 MG DR tablet TAKE 1 TABLET BY MOUTH TWO TO THREE TIMES DAILY. 270 tablet 1  . Multiple Vitamins-Minerals (CENTRUM ADULTS) TABS Take by mouth daily.    . Adalimumab (HUMIRA PEN [place]) Inject into the skin.     Marland Kitchen rOPINIRole (REQUIP) 0.25 MG tablet Take 0.25 mg by mouth as needed (RLS).     No current facility-administered medications on file prior to visit.    Allergies  Allergen Reactions  . Lithium Other (See Comments)    Boils  . Penicillins Hives    Family History  Problem Relation Age of Onset  . Mental illness Mother     Living  . Dementia Mother   . Parkinson's disease Father     Deceased  . Alzheimer's disease Father   . Cancer Maternal Grandmother   . Diabetes Maternal  Uncle   . Arthritis Maternal Aunt     Psoriatic   . Alcohol abuse Brother   . Healthy Brother   . Healthy Sister   . Allergies Daughter   . Healthy Daughter   . Healthy Son     x2    Social History   Social History  . Marital Status: Married    Spouse Name: N/A  . Number of Children: N/A  . Years of Education: N/A   Social History Main Topics  . Smoking status: Current Some Day Smoker  . Smokeless tobacco: Never Used  . Alcohol Use: None     Comment: very rare  . Drug Use: No  . Sexual Activity: Not Asked   Other Topics Concern  . None   Social History Narrative   Review of Systems - See HPI.  All other ROS are negative.  BP 95/62 mmHg  Pulse 72  Temp(Src) 98.7 F (37.1 C) (Oral)  Resp 16  Ht 5\' 4"  (1.626 m)  Wt 156 lb 8 oz (70.988 kg)  BMI 26.85 kg/m2  SpO2 98%  Physical Exam  Constitutional: She is oriented to person, place, and time and well-developed, well-nourished, and in no distress.  HENT:  Head: Normocephalic and atraumatic.  Eyes: Conjunctivae are normal.  Neck: Neck  supple.  Cardiovascular: Normal rate, regular rhythm, normal heart sounds and intact distal pulses.   Pulmonary/Chest: Effort normal and breath sounds normal. No respiratory distress. She has no wheezes. She has no rales. She exhibits no tenderness.  Neurological: She is alert and oriented to person, place, and time.  Vitals reviewed.  No results found for this or any previous visit (from the past 2160 hour(s)).  Assessment/Plan: Bipolar disorder Will continue the Depakote. Will add-on Latuda 20 mg daily. Will follow-up in 1 month.  Breast cancer screening Order for screening mammogram placed.  Colon cancer screening Referral to GI for screening colonoscopy placed.

## 2015-09-02 NOTE — Patient Instructions (Signed)
Please continue Depakote as directed. Add on the Latuda daily.   Follow-up with me in 1 month.  You will be contacted for a colonoscopy and mammogram

## 2015-09-02 NOTE — Assessment & Plan Note (Signed)
Referral to GI for screening colonoscopy placed.

## 2015-09-02 NOTE — Assessment & Plan Note (Signed)
Order for screening mammogram placed.  

## 2015-09-05 ENCOUNTER — Telehealth: Payer: Self-pay | Admitting: Physician Assistant

## 2015-09-05 NOTE — Telephone Encounter (Signed)
Received denial from [employer]. Alternatives include olanzapine, quetiapine, risperidone, Saphris, Seroquel XR, and aripiprazole. If pt has or does fail two of these alternatives, then Jordan can be approved.

## 2015-09-05 NOTE — Telephone Encounter (Signed)
LMOM with contact name and number for return call RE: new Rx and further provider instructions/SLS

## 2015-09-05 NOTE — Telephone Encounter (Signed)
Please inform patient the Stephanie Spencer was denies. Would recommend Abilify as next agent to add on. Let me know if she is willing to give it a try or if she would like to try increased Depakote dose again.

## 2015-09-05 NOTE — Telephone Encounter (Signed)
Duplicate Note.

## 2015-09-05 NOTE — Telephone Encounter (Signed)
Pt is returning your call. She says that she will be better available after 3:30 she's at work

## 2015-09-05 NOTE — Telephone Encounter (Signed)
error 

## 2015-09-06 MED ORDER — ARIPIPRAZOLE 5 MG PO TABS: 5.0000 mg | ORAL_TABLET | Freq: Every day | ORAL | Status: DC

## 2015-09-06 NOTE — Telephone Encounter (Signed)
Patient informed, understood & agreed to try Abilify [believes she may have failed on Olanapine]; informed if Abilify is subtherapeutic, then we have the two medications on Insurance list to file for Approval of Latuda again/SLS Please Advise on Abilify dosing instructions/SLS

## 2015-09-06 NOTE — Telephone Encounter (Signed)
Rx Abilify 5 mg daily sent to pharmacy. Follow-up 1 month.

## 2015-09-06 NOTE — Telephone Encounter (Signed)
Spoke with pt and she voices understanding.  

## 2015-09-06 NOTE — Addendum Note (Signed)
Addended by: Marcelline Mates on: 09/06/2015 10:22 AM   Modules accepted: Orders

## 2015-09-07 ENCOUNTER — Ambulatory Visit: Payer: Commercial Managed Care - PPO | Admitting: Physician Assistant

## 2015-10-01 ENCOUNTER — Other Ambulatory Visit: Payer: Self-pay | Admitting: Physician Assistant

## 2015-10-06 ENCOUNTER — Telehealth: Payer: Self-pay | Admitting: Physician Assistant

## 2015-10-06 NOTE — Telephone Encounter (Signed)
Pt wanted me to update provider  has not started new meds yet, waiting for a time her husband is home in case of reaction, will call to reschedule in the new year

## 2015-10-06 NOTE — Telephone Encounter (Signed)
Noted  

## 2015-10-07 ENCOUNTER — Ambulatory Visit: Payer: Commercial Managed Care - PPO | Admitting: Physician Assistant

## 2015-10-14 ENCOUNTER — Other Ambulatory Visit: Payer: Self-pay | Admitting: Physician Assistant

## 2015-10-14 DIAGNOSIS — Z1231 Encounter for screening mammogram for malignant neoplasm of breast: Secondary | ICD-10-CM

## 2015-10-21 ENCOUNTER — Ambulatory Visit (HOSPITAL_BASED_OUTPATIENT_CLINIC_OR_DEPARTMENT_OTHER)
Admission: RE | Admit: 2015-10-21 | Discharge: 2015-10-21 | Disposition: A | Discharge status: Home / Self Care | Payer: Commercial Managed Care - PPO | Source: Ambulatory Visit | Attending: Physician Assistant | Admitting: Physician Assistant

## 2015-10-21 DIAGNOSIS — Z1231 Encounter for screening mammogram for malignant neoplasm of breast: Secondary | ICD-10-CM | POA: Diagnosis not present

## 2015-10-28 ENCOUNTER — Ambulatory Visit (AMBULATORY_SURGERY_CENTER): Payer: Self-pay | Admitting: *Deleted

## 2015-10-28 VITALS — Ht 63.0 in | Wt 162.4 lb

## 2015-10-28 DIAGNOSIS — Z1211 Encounter for screening for malignant neoplasm of colon: Secondary | ICD-10-CM

## 2015-10-28 NOTE — Progress Notes (Signed)
Denies allergies to eggs or soy products. Denies O2 use. Denies use of diet or weight loss medications.  Patient and husband relayed information about difficulty with sedation or anesthesia. Patient states she had a hysterectomy approximately 3 years ago in [place]. 6 weeks later she states she had a bursa removed in an outpatient procedure. She takes Divalprox for bipolar disorder. She was advised to not take any of her medications the day of her procedures. After the bursa surgery she reports she had 2 seizures. She does not remember who did her bursa procedure or where to request records. Her Gyn told her she should probably not have any procedures in an outpatient setting in the future. We talked about this being an ambulatory care center, outpatient. She currently takes Depakote. I suggested getting records so we could evaluate what really happened postoperatively.I discussed with Kathlene November if he felt there would be any reason not to do in our center. I also advised an office visit to discuss with Dr. Leone Payor as pt seemed concerned. She then decided she did not want to delay colonoscopy any longer, said she felt comfortable with doing it here. PV was completed and I told her I would share information with Dr. Leone Payor and if he felt we needed to discuss/explore further, we may need to do so. Patient has had no further seizures. Patient and husband in agreement.  Emmi instructions given for colonoscopy.

## 2015-11-01 ENCOUNTER — Other Ambulatory Visit: Payer: Self-pay | Admitting: Physician Assistant

## 2015-11-11 ENCOUNTER — Encounter: Payer: Self-pay | Admitting: Internal Medicine

## 2015-11-11 ENCOUNTER — Ambulatory Visit (AMBULATORY_SURGERY_CENTER): Payer: Commercial Managed Care - PPO | Admitting: Internal Medicine

## 2015-11-11 VITALS — BP 120/75 | HR 83 | Temp 96.8°F | Resp 22 | Ht 64.0 in | Wt 156.0 lb

## 2015-11-11 DIAGNOSIS — Z1211 Encounter for screening for malignant neoplasm of colon: Secondary | ICD-10-CM

## 2015-11-11 MED ORDER — SODIUM CHLORIDE 0.9 % IV SOLN: 500.0000 mL | INTRAVENOUS | Status: DC

## 2015-11-11 NOTE — Op Note (Signed)
[facility] [address] [place], [address]   COLONOSCOPY PROCEDURE REPORT  PATIENT: Stephanie Spencer, Stephanie Spencer  MR#: [id number] BIRTHDATE: 01-01-1961 , 54  yrs. old GENDER: female ENDOSCOPIST: Iva Boop, MD, [facility] PROCEDURE DATE:  11/11/2015 PROCEDURE:   Colonoscopy, screening First Screening Colonoscopy - Avg.  risk and is 50 yrs.  old or older Yes.  Prior Negative Screening - Now for repeat screening. N/A  History of Adenoma - Now for follow-up colonoscopy & has been > or = to 3 yrs.  N/A  Polyps removed today? No Recommend repeat exam, <10 yrs? No ASA CLASS:   Class II INDICATIONS:Screening for colonic neoplasia and Colorectal Neoplasm Risk Assessment for this procedure is average risk. MEDICATIONS: Propofol 200 mg IV and Monitored anesthesia care  DESCRIPTION OF PROCEDURE:   After the risks benefits and alternatives of the procedure were thoroughly explained, informed consent was obtained.  The digital rectal exam revealed no abnormalities of the rectum.   The LB [id number]  endoscope was introduced through the anus and advanced to the cecum, which was identified by both the appendix and ileocecal valve. No adverse events experienced.   The quality of the prep was good.  (MiraLax was used)  The instrument was then slowly withdrawn as the colon was fully examined. Estimated blood loss is zero unless otherwise noted in this procedure report.      COLON FINDINGS: A normal appearing cecum, ileocecal valve, and appendiceal orifice were identified.  The ascending, transverse, descending, sigmoid colon, and rectum appeared unremarkable. Right colon retroflexion included.  Retroflexed views revealed no abnormalities. The time to cecum = 2.6 Withdrawal time = 8.0   The scope was withdrawn and the procedure completed. COMPLICATIONS: There were no immediate complications.  ENDOSCOPIC IMPRESSION: Normal colonoscopy - good prep - first  screen  RECOMMENDATIONS: Repeat colonoscopy/screening test 10 years.  eSigned:  Iva Boop, MD, [facility] 11/11/2015 8:58 AM   cc: Malva Cogan PA-C and The Patient

## 2015-11-11 NOTE — Progress Notes (Signed)
To recovery, report to Smith, RN, VSS 

## 2015-11-11 NOTE — Patient Instructions (Addendum)
Your colonoscopy was normal!  Next routine colonoscopy/screening test in 10 years - 2027  I appreciate the opportunity to care for you. Iva Boop, MD, FACG  YOU HAD AN ENDOSCOPIC PROCEDURE TODAY AT THE [facility]:   Refer to the procedure report that was given to you for any specific questions about what was found during the examination.  If the procedure report does not answer your questions, please call your gastroenterologist to clarify.  If you requested that your care partner not be given the details of your procedure findings, then the procedure report has been included in a sealed envelope for you to review at your convenience later.  YOU SHOULD EXPECT: Some feelings of bloating in the abdomen. Passage of more gas than usual.  Walking can help get rid of the air that was put into your GI tract during the procedure and reduce the bloating. If you had a lower endoscopy (such as a colonoscopy or flexible sigmoidoscopy) you may notice spotting of blood in your stool or on the toilet paper. If you underwent a bowel prep for your procedure, you may not have a normal bowel movement for a few days.  Please Note:  You might notice some irritation and congestion in your nose or some drainage.  This is from the oxygen used during your procedure.  There is no need for concern and it should clear up in a day or so.  SYMPTOMS TO REPORT IMMEDIATELY:   Following lower endoscopy (colonoscopy or flexible sigmoidoscopy):  Excessive amounts of blood in the stool  Significant tenderness or worsening of abdominal pains  Swelling of the abdomen that is new, acute  Fever of 100F or higher   For urgent or emergent issues, a gastroenterologist can be reached at any hour by calling [phone].   DIET: Your first meal following the procedure should be a small meal and then it is ok to progress to your normal diet. Heavy or fried foods are harder to digest and may make you feel  nauseous or bloated.  Likewise, meals heavy in dairy and vegetables can increase bloating.  Drink plenty of fluids but you should avoid alcoholic beverages for 24 hours.  ACTIVITY:  You should plan to take it easy for the rest of today and you should NOT DRIVE or use heavy machinery until tomorrow (because of the sedation medicines used during the test).    FOLLOW UP: Our staff will call the number listed on your records the next business day following your procedure to check on you and address any questions or concerns that you may have regarding the information given to you following your procedure. If we do not reach you, we will leave a message.  However, if you are feeling well and you are not experiencing any problems, there is no need to return our call.  We will assume that you have returned to your regular daily activities without incident.  If any biopsies were taken you will be contacted by phone or by letter within the next 1-3 weeks.  Please call us at [phone] if you have not heard about the biopsies in 3 weeks.    SIGNATURES/CONFIDENTIALITY: You and/or your care partner have signed paperwork which will be entered into your electronic medical record.  These signatures attest to the fact that that the information above on your After Visit Summary has been reviewed and is understood.  Full responsibility of the confidentiality of this discharge information lies  with you and/or your care-partner.

## 2015-11-14 ENCOUNTER — Telehealth: Payer: Self-pay

## 2015-11-14 NOTE — Telephone Encounter (Signed)
  Follow up Call-  Call back number 11/11/2015  Post procedure Call Back phone  # [phone]  Permission to leave phone message Yes     Patient questions:  Do you have a fever, pain , or abdominal swelling? No. Pain Score  0 *  Have you tolerated food without any problems? Yes.    Have you been able to return to your normal activities? Yes.    Do you have any questions about your discharge instructions: Diet   No. Medications  No. Follow up visit  No.  Do you have questions or concerns about your Care? No.  Actions: * If pain score is 4 or above: No action needed, pain <4.

## 2015-12-02 ENCOUNTER — Other Ambulatory Visit: Payer: Self-pay | Admitting: Physician Assistant

## 2015-12-19 ENCOUNTER — Encounter: Payer: Self-pay | Admitting: Physician Assistant

## 2015-12-19 ENCOUNTER — Ambulatory Visit (INDEPENDENT_AMBULATORY_CARE_PROVIDER_SITE_OTHER): Payer: Commercial Managed Care - PPO | Admitting: Physician Assistant

## 2015-12-19 VITALS — BP 108/66 | HR 86 | Temp 98.6°F | Ht 64.0 in | Wt 156.6 lb

## 2015-12-19 DIAGNOSIS — J019 Acute sinusitis, unspecified: Secondary | ICD-10-CM | POA: Diagnosis not present

## 2015-12-19 LAB — POCT INFLUENZA A/B
INFLUENZA A, POC: NEGATIVE
INFLUENZA B, POC: NEGATIVE

## 2015-12-19 MED ORDER — DOXYCYCLINE HYCLATE 100 MG PO CAPS: 100.0000 mg | ORAL_CAPSULE | Freq: Two times a day (BID) | ORAL | Status: DC

## 2015-12-19 MED ORDER — FLUCONAZOLE 150 MG PO TABS
150.0000 mg | ORAL_TABLET | Freq: Once | ORAL | Status: DC
Start: 2015-12-19 — End: 2016-03-02

## 2015-12-19 NOTE — Progress Notes (Signed)
Pre visit review using our clinic review tool, if applicable. No additional management support is needed unless otherwise documented below in the visit note. 

## 2015-12-19 NOTE — Assessment & Plan Note (Addendum)
Flu swab negative. Afebrile but significant TTP. Rx Doxycycline.  Increase fluids.  Rest.  Saline nasal spray.  Probiotic.  Mucinex as directed.  Humidifier in bedroom. Rx Diflucan given as patient prone to yeast due to ABX..  Call or return to clinic if symptoms are not improving.

## 2015-12-19 NOTE — Patient Instructions (Signed)
Please take antibiotic as directed.  Increase fluid intake.  Use Saline nasal spray.  Take a daily multivitamin. Continue other medications as directed.  Place a humidifier in the bedroom.  Please call or return clinic if symptoms are not improving.  Sinusitis Sinusitis is redness, soreness, and swelling (inflammation) of the paranasal sinuses. Paranasal sinuses are air pockets within the bones of your face (beneath the eyes, the middle of the forehead, or above the eyes). In healthy paranasal sinuses, mucus is able to drain out, and air is able to circulate through them by way of your nose. However, when your paranasal sinuses are inflamed, mucus and air can become trapped. This can allow bacteria and other germs to grow and cause infection. Sinusitis can develop quickly and last only a short time (acute) or continue over a long period (chronic). Sinusitis that lasts for more than 12 weeks is considered chronic.  CAUSES  Causes of sinusitis include:  Allergies.  Structural abnormalities, such as displacement of the cartilage that separates your nostrils (deviated septum), which can decrease the air flow through your nose and sinuses and affect sinus drainage.  Functional abnormalities, such as when the small hairs (cilia) that line your sinuses and help remove mucus do not work properly or are not present. SYMPTOMS  Symptoms of acute and chronic sinusitis are the same. The primary symptoms are pain and pressure around the affected sinuses. Other symptoms include:  Upper toothache.  Earache.  Headache.  Bad breath.  Decreased sense of smell and taste.  A cough, which worsens when you are lying flat.  Fatigue.  Fever.  Thick drainage from your nose, which often is green and may contain pus (purulent).  Swelling and warmth over the affected sinuses. DIAGNOSIS  Your caregiver will perform a physical exam. During the exam, your caregiver may:  Look in your nose for signs of abnormal  growths in your nostrils (nasal polyps).  Tap over the affected sinus to check for signs of infection.  View the inside of your sinuses (endoscopy) with a special imaging device with a light attached (endoscope), which is inserted into your sinuses. If your caregiver suspects that you have chronic sinusitis, one or more of the following tests may be recommended:  Allergy tests.  Nasal culture A sample of mucus is taken from your nose and sent to a lab and screened for bacteria.  Nasal cytology A sample of mucus is taken from your nose and examined by your caregiver to determine if your sinusitis is related to an allergy. TREATMENT  Most cases of acute sinusitis are related to a viral infection and will resolve on their own within 10 days. Sometimes medicines are prescribed to help relieve symptoms (pain medicine, decongestants, nasal steroid sprays, or saline sprays).  However, for sinusitis related to a bacterial infection, your caregiver will prescribe antibiotic medicines. These are medicines that will help kill the bacteria causing the infection.  Rarely, sinusitis is caused by a fungal infection. In theses cases, your caregiver will prescribe antifungal medicine. For some cases of chronic sinusitis, surgery is needed. Generally, these are cases in which sinusitis recurs more than 3 times per year, despite other treatments. HOME CARE INSTRUCTIONS   Drink plenty of water. Water helps thin the mucus so your sinuses can drain more easily.  Use a humidifier.  Inhale steam 3 to 4 times a day (for example, sit in the bathroom with the shower running).  Apply a warm, moist washcloth to your face 3   to 4 times a day, or as directed by your caregiver.  Use saline nasal sprays to help moisten and clean your sinuses.  Take over-the-counter or prescription medicines for pain, discomfort, or fever only as directed by your caregiver. SEEK IMMEDIATE MEDICAL CARE IF:  You have increasing pain or  severe headaches.  You have nausea, vomiting, or drowsiness.  You have swelling around your face.  You have vision problems.  You have a stiff neck.  You have difficulty breathing. MAKE SURE YOU:   Understand these instructions.  Will watch your condition.  Will get help right away if you are not doing well or get worse. Document Released: 10/15/2005 Document Revised: 01/07/2012 Document Reviewed: 10/30/2011 [employer] Patient Information 2014 [employer].   

## 2015-12-19 NOTE — Addendum Note (Signed)
Addended by: Verdie Shire on: 12/19/2015 12:21 PM   Modules accepted: Orders

## 2015-12-19 NOTE — Progress Notes (Signed)
Patient presents to clinic today c/o 2 days of sinus pressure, sinus pain, fatigue, aches and chills. Endorses low-grade fever. Endorses tooth pain bilaterally and mild ear pain. Denies recent travel or sick contact. Patient has taken Mucinex-DM and Ibuprofen for symptoms. Patient has not had the flu shot.  Past Medical History  Diagnosis Date  . Chronic rheumatic arthritis (HCC)   . Hemorrhoids   . Chicken pox   . Mumps   . Hyperlipidemia     Borderline  . Urinary incontinence     Leakage  . UTI (lower urinary tract infection)   . GERD (gastroesophageal reflux disease)   . RLS (restless legs syndrome)     Current Outpatient Prescriptions on File Prior to Visit  Medication Sig Dispense Refill  . acyclovir (ZOVIRAX) 800 MG tablet TAKE 1 TABLET(800 MG) BY MOUTH DAILY 45 tablet 5  . ALPRAZolam (XANAX) 0.5 MG tablet Take 0.5 mg by mouth as needed for anxiety. Reported on 10/28/2015    . ARIPiprazole (ABILIFY) 5 MG tablet TAKE 1 TABLET BY MOUTH DAILY 30 tablet 1  . Ascorbic Acid (VITAMIN C) 1000 MG tablet Take 1,000 mg by mouth daily.    . Bacillus Coagulans-Inulin (PROBIOTIC FORMULA) 1-250 BILLION-MG CAPS Take by mouth daily.    . citalopram (CELEXA) 20 MG tablet TAKE 1 TABLET BY MOUTH DAILY 90 tablet 1  . Cranberry-Vitamin C 84-20 MG CAPS Take by mouth daily.    . diclofenac sodium (VOLTAREN) 1 % GEL Apply 2 g topically 4 (four) times daily. 100 g 3  . divalproex (DEPAKOTE) 250 MG DR tablet TAKE 1 TABLET BY MOUTH 2 TO 3 TIMES DAILY 270 tablet 1  . Multiple Vitamins-Minerals (CENTRUM ADULTS) TABS Take by mouth daily.     No current facility-administered medications on file prior to visit.    Allergies  Allergen Reactions  . Lithium Other (See Comments)    Boils  . Penicillins Hives    Family History  Problem Relation Age of Onset  . Mental illness Mother     Living  . Dementia Mother   . Parkinson's disease Father     Deceased  . Alzheimer's disease Father   . Cancer  Maternal Grandmother   . Diabetes Maternal Uncle   . Arthritis Maternal Aunt     Psoriatic   . Alcohol abuse Brother   . Healthy Brother   . Healthy Sister   . Allergies Daughter   . Healthy Daughter   . Healthy Son     x2  . Colon cancer Neg Hx     Social History   Social History  . Marital Status: Married    Spouse Name: N/A  . Number of Children: N/A  . Years of Education: N/A   Social History Main Topics  . Smoking status: Former Smoker    Quit date: 06/30/2015  . Smokeless tobacco: Never Used  . Alcohol Use: 0.0 oz/week    0 Standard drinks or equivalent per week     Comment: very rare  . Drug Use: Yes    Special: Marijuana     Comment: 3-4 times per week  . Sexual Activity: Not Asked   Other Topics Concern  . None   Social History Narrative   Review of Systems - See HPI.  All other ROS are negative.  BP 108/66 mmHg  Pulse 86  Temp(Src) 98.6 F (37 C) (Oral)  Ht 5\' 4"  (1.626 m)  Wt 156 lb 9.6 oz (71.033 kg)  BMI 26.87 kg/m2  SpO2 99%  Physical Exam  Constitutional: She is oriented to person, place, and time and well-developed, well-nourished, and in no distress.  HENT:  Head: Normocephalic and atraumatic.  Right Ear: Tympanic membrane normal.  Left Ear: Tympanic membrane normal.  Nose: Mucosal edema and rhinorrhea present. Right sinus exhibits maxillary sinus tenderness. Left sinus exhibits maxillary sinus tenderness.  Mouth/Throat: Uvula is midline, oropharynx is clear and moist and mucous membranes are normal.  Eyes: Conjunctivae are normal.  Cardiovascular: Normal rate, regular rhythm, normal heart sounds and intact distal pulses.   Pulmonary/Chest: Effort normal and breath sounds normal. No respiratory distress. She has no wheezes. She has no rales. She exhibits no tenderness.  Neurological: She is alert and oriented to person, place, and time.  Skin: Skin is warm and dry. No rash noted.  Psychiatric: Affect normal.  Vitals  reviewed.  Assessment/Plan: Acute infection of nasal sinus Flu swab negative. Afebrile but significant TTP. Rx Doxycycline.  Increase fluids.  Rest.  Saline nasal spray.  Probiotic.  Mucinex as directed.  Humidifier in bedroom. Rx Diflucan given as patient prone to yeast due to ABX..  Call or return to clinic if symptoms are not improving.

## 2015-12-31 ENCOUNTER — Other Ambulatory Visit: Payer: Self-pay | Admitting: Physician Assistant

## 2016-03-02 ENCOUNTER — Other Ambulatory Visit: Payer: Self-pay | Admitting: Physician Assistant

## 2016-03-02 NOTE — Telephone Encounter (Signed)
Rx request to pharmacy/SLS  

## 2016-05-02 ENCOUNTER — Other Ambulatory Visit: Payer: Self-pay | Admitting: Physician Assistant

## 2016-05-02 NOTE — Telephone Encounter (Signed)
Rx request to pharmacy/SLS  

## 2016-05-04 ENCOUNTER — Encounter: Payer: Self-pay | Admitting: Physician Assistant

## 2016-05-04 ENCOUNTER — Ambulatory Visit (INDEPENDENT_AMBULATORY_CARE_PROVIDER_SITE_OTHER): Payer: Commercial Managed Care - PPO | Admitting: Physician Assistant

## 2016-05-04 VITALS — BP 104/66 | HR 96 | Temp 97.7°F | Resp 16 | Ht 64.0 in | Wt 161.0 lb

## 2016-05-04 DIAGNOSIS — Z5181 Encounter for therapeutic drug level monitoring: Secondary | ICD-10-CM

## 2016-05-04 DIAGNOSIS — L989 Disorder of the skin and subcutaneous tissue, unspecified: Secondary | ICD-10-CM | POA: Diagnosis not present

## 2016-05-04 NOTE — Patient Instructions (Signed)
Please go to the lab for blood work. I will call with results.  You will be contacted by Dermatology for assessment of the skin lesion -- I am concerned it is the start of squamous cell carcinoma

## 2016-05-04 NOTE — Progress Notes (Signed)
Pre visit review using our clinic review tool, if applicable. No additional management support is needed unless otherwise documented below in the visit note/SLS  

## 2016-05-04 NOTE — Progress Notes (Signed)
Patient presents to clinic today for follow-up of Bipolar Disorder. Patient has been well maintained on Depakote DR 250 mg TID. Endorses mood is doing very well overall. Denies manic episode or depressed mood. Denies anxiety. Denies SI/HI. Is overdue for repeat Depakote level.  Patient also complains of hard "bump" just distal to elbow on left arm x several months. Is not painful or itchy but is not healing. Patient denies hx of skin cancer but tans in the tanning bed a few times per week.  Past Medical History  Diagnosis Date  . Chronic rheumatic arthritis (HCC)   . Hemorrhoids   . Chicken pox   . Mumps   . Hyperlipidemia     Borderline  . Urinary incontinence     Leakage  . UTI (lower urinary tract infection)   . GERD (gastroesophageal reflux disease)   . RLS (restless legs syndrome)     Current Outpatient Prescriptions on File Prior to Visit  Medication Sig Dispense Refill  . acyclovir (ZOVIRAX) 800 MG tablet TAKE 1 TABLET(800 MG) BY MOUTH DAILY 45 tablet 2  . ALPRAZolam (XANAX) 0.5 MG tablet Take 0.5 mg by mouth as needed for anxiety. Reported on 10/28/2015    . ARIPiprazole (ABILIFY) 5 MG tablet TAKE 1 TABLET BY MOUTH DAILY 30 tablet 5  . Ascorbic Acid (VITAMIN C) 1000 MG tablet Take 1,000 mg by mouth daily.    . Bacillus Coagulans-Inulin (PROBIOTIC FORMULA) 1-250 BILLION-MG CAPS Take by mouth daily.    . citalopram (CELEXA) 20 MG tablet TAKE 1 TABLET BY MOUTH DAILY 90 tablet 0  . Cranberry-Vitamin C 84-20 MG CAPS Take by mouth daily.    . diclofenac sodium (VOLTAREN) 1 % GEL Apply 2 g topically 4 (four) times daily. (Patient taking differently: Apply 2 g topically 4 (four) times daily as needed. ) 100 g 3  . divalproex (DEPAKOTE) 250 MG DR tablet TAKE 1 TABLET BY MOUTH 2 TO 3 TIMES DAILY 270 tablet 1  . Multiple Vitamins-Minerals (CENTRUM ADULTS) TABS Take by mouth daily.     No current facility-administered medications on file prior to visit.    Allergies  Allergen  Reactions  . Lithium Other (See Comments)    Boils  . Penicillins Hives    Family History  Problem Relation Age of Onset  . Mental illness Mother     Living  . Dementia Mother   . Parkinson's disease Father     Deceased  . Alzheimer's disease Father   . Cancer Maternal Grandmother   . Diabetes Maternal Uncle   . Arthritis Maternal Aunt     Psoriatic   . Alcohol abuse Brother   . Healthy Brother   . Healthy Sister   . Allergies Daughter   . Healthy Daughter   . Healthy Son     x2  . Colon cancer Neg Hx     Social History   Social History  . Marital Status: Married    Spouse Name: N/A  . Number of Children: N/A  . Years of Education: N/A   Social History Main Topics  . Smoking status: Former Smoker    Quit date: 06/30/2015  . Smokeless tobacco: Never Used  . Alcohol Use: 0.0 oz/week    0 Standard drinks or equivalent per week     Comment: very rare  . Drug Use: Yes    Special: Marijuana     Comment: 3-4 times per week  . Sexual Activity: Not Asked   Other Topics  Concern  . None   Social History Narrative   Review of Systems - See HPI.  All other ROS are negative.  BP 104/66 mmHg  Pulse 96  Temp(Src) 97.7 F (36.5 C) (Oral)  Resp 16  Ht 5\' 4"  (1.626 m)  Wt 161 lb (73.029 kg)  BMI 27.62 kg/m2  SpO2 97%  Physical Exam  Constitutional: She is oriented to person, place, and time and well-developed, well-nourished, and in no distress.  HENT:  Head: Normocephalic and atraumatic.  Eyes: Conjunctivae are normal.  Cardiovascular: Normal rate, regular rhythm, normal heart sounds and intact distal pulses.   Pulmonary/Chest: Effort normal and breath sounds normal. No respiratory distress. She has no wheezes. She has no rales. She exhibits no tenderness.  Neurological: She is alert and oriented to person, place, and time.  Skin: Skin is warm and dry.     Vitals reviewed.    Assessment/Plan: 1. Skin lesion of left arm Concern for malignancy. Referral  to Dermatology placed for excisional biopsy.  - Ambulatory referral to Dermatology  2. Therapeutic drug monitoring Doing very well on Depakote for some time. Will continue current regimen and repeat valproic acid level today. FU 6 months. - Valproic Acid level   , PA-C

## 2016-05-05 LAB — VALPROIC ACID LEVEL: VALPROIC ACID LVL: 61.1 ug/mL (ref 50.0–100.0)

## 2016-05-08 ENCOUNTER — Encounter: Payer: Self-pay | Admitting: Physician Assistant

## 2016-06-01 ENCOUNTER — Other Ambulatory Visit: Payer: Self-pay | Admitting: Physician Assistant

## 2016-06-01 NOTE — Telephone Encounter (Signed)
Rx request to pharmacy/SLS  

## 2016-06-02 ENCOUNTER — Other Ambulatory Visit: Payer: Self-pay | Admitting: Physician Assistant

## 2016-06-30 ENCOUNTER — Other Ambulatory Visit: Payer: Self-pay | Admitting: Physician Assistant

## 2016-07-03 NOTE — Telephone Encounter (Signed)
Rx request to pharmacy; Depakote Refused, last Rx 06/03/16, #[id number]-Too Soon/SLS 09/05

## 2016-08-04 ENCOUNTER — Other Ambulatory Visit: Payer: Self-pay | Admitting: Physician Assistant

## 2016-08-06 NOTE — Telephone Encounter (Signed)
Refill sent per [facility] refill protocol/SLS  

## 2016-08-12 ENCOUNTER — Other Ambulatory Visit: Payer: Self-pay | Admitting: Physician Assistant

## 2016-08-14 NOTE — Telephone Encounter (Signed)
Selena Batten, Just saw this in my inbasket. Sorry I missed this yesterday.  Thanks.

## 2016-08-31 ENCOUNTER — Other Ambulatory Visit: Payer: Self-pay | Admitting: Physician Assistant

## 2016-08-31 NOTE — Telephone Encounter (Signed)
Rx request to pharmacy/SLS  

## 2016-09-03 NOTE — Telephone Encounter (Signed)
Last OV: 05/04/16 ROV: 6-Mths. Last Rx: 08/06/16, #30x0 Refill sent per [facility] refill protocol/SLS

## 2016-09-14 ENCOUNTER — Other Ambulatory Visit: Payer: Self-pay | Admitting: Physician Assistant

## 2016-09-17 ENCOUNTER — Other Ambulatory Visit: Payer: Self-pay | Admitting: Family

## 2016-09-17 NOTE — Telephone Encounter (Signed)
Refill sent per [facility] refill protocol/SLS 11/20

## 2016-09-28 ENCOUNTER — Encounter: Payer: Self-pay | Admitting: Physician Assistant

## 2016-09-28 ENCOUNTER — Ambulatory Visit (INDEPENDENT_AMBULATORY_CARE_PROVIDER_SITE_OTHER): Payer: Commercial Managed Care - PPO | Admitting: Physician Assistant

## 2016-09-28 VITALS — BP 98/60 | HR 68 | Temp 97.9°F | Resp 14 | Ht 64.0 in | Wt 165.0 lb

## 2016-09-28 DIAGNOSIS — Z5181 Encounter for therapeutic drug level monitoring: Secondary | ICD-10-CM

## 2016-09-28 DIAGNOSIS — F3161 Bipolar disorder, current episode mixed, mild: Secondary | ICD-10-CM

## 2016-09-28 LAB — COMPREHENSIVE METABOLIC PANEL
ALT: 17 U/L (ref 0–35)
AST: 14 U/L (ref 0–37)
Albumin: 4.2 g/dL (ref 3.5–5.2)
Alkaline Phosphatase: 36 U/L — ABNORMAL LOW (ref 39–117)
BILIRUBIN TOTAL: 0.5 mg/dL (ref 0.2–1.2)
BUN: 15 mg/dL (ref 6–23)
CHLORIDE: 104 meq/L (ref 96–112)
CO2: 32 meq/L (ref 19–32)
CREATININE: 0.76 mg/dL (ref 0.40–1.20)
Calcium: 9.6 mg/dL (ref 8.4–10.5)
GFR: 83.79 mL/min (ref >60.00)
GLUCOSE: 78 mg/dL (ref 70–99)
Potassium: 4.5 mEq/L (ref 3.5–5.1)
Sodium: 143 mEq/L (ref 135–145)
Total Protein: 7 g/dL (ref 6.0–8.3)

## 2016-09-28 LAB — CBC
HCT: 37.2 % (ref 36.0–46.0)
Hemoglobin: 12.6 g/dL (ref 12.0–15.0)
MCHC: 34 g/dL (ref 30.0–36.0)
MCV: 91.6 fl (ref 78.0–100.0)
Platelets: 192 10*3/uL (ref 150.0–400.0)
RBC: 4.07 Mil/uL (ref 3.87–5.11)
RDW: 12.8 % (ref 11.5–15.5)
WBC: 5.5 10*3/uL (ref 4.0–10.5)

## 2016-09-28 NOTE — Progress Notes (Signed)
Patient presents to clinic today for follow-up of Bipolar Disorder, mixed. Patient is currently on multi-drug regimen and has done very well so far with medications. Patient currently on Citalopram 20 mg daily, Abilify 5 mg daily and Depakote 250 mg TID. Depakote levels previously stable on this regimen. Patient endorses doing very well. 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 Denies manic episode, breakthrough depression or panic attack.  Past Medical History:  Diagnosis Date  . Chicken pox   . Chronic rheumatic arthritis (HCC)   . GERD (gastroesophageal reflux disease)   . Hemorrhoids   . Hyperlipidemia    Borderline  . Mumps   . RLS (restless legs syndrome)   . Urinary incontinence    Leakage  . UTI (lower urinary tract infection)     Current Outpatient Prescriptions on File Prior to Visit  Medication Sig Dispense Refill  . acyclovir (ZOVIRAX) 800 MG tablet TAKE 1 TABLET(800 MG) BY MOUTH DAILY 45 tablet 0  . ALPRAZolam (XANAX) 0.5 MG tablet Take 0.5 mg by mouth as needed for anxiety. Reported on 10/28/2015    . ARIPiprazole (ABILIFY) 5 MG tablet TAKE 1 TABLET BY MOUTH DAILY 30 tablet 2  . Ascorbic Acid (VITAMIN C) 1000 MG tablet Take 1,000 mg by mouth daily.    . Bacillus Coagulans-Inulin (PROBIOTIC FORMULA) 1-250 BILLION-MG CAPS Take by mouth daily.    . citalopram (CELEXA) 20 MG tablet TAKE 1 TABLET BY MOUTH DAILY 90 tablet 0  . Cranberry-Vitamin C 84-20 MG CAPS Take by mouth daily.    . diclofenac sodium (VOLTAREN) 1 % GEL Apply 2 g topically 4 (four) times daily. (Patient taking differently: Apply 2 g topically 4 (four) times daily as needed. ) 100 g 3  . divalproex (DEPAKOTE) 250 MG DR tablet TAKE 1 TABLET(250 MG) BY MOUTH THREE TIMES DAILY. FOLLOW-UP BEFORE FURTHER REFILLS 90 tablet 0  . Multiple Vitamins-Minerals (CENTRUM ADULTS) TABS Take by mouth daily.     No current facility-administered medications on file prior to visit.     Allergies  Allergen Reactions  . Lithium Other  (See Comments)    Boils  . Penicillins Hives    Family History  Problem Relation Age of Onset  . Mental illness Mother     Living  . Dementia Mother   . Parkinson's disease Father     Deceased  . Alzheimer's disease Father   . Cancer Maternal Grandmother   . Diabetes Maternal Uncle   . Arthritis Maternal Aunt     Psoriatic   . Alcohol abuse Brother   . Healthy Brother   . Healthy Sister   . Allergies Daughter   . Healthy Daughter   . Healthy Son     x2  . Colon cancer Neg Hx     Social History   Social History  . Marital status: Married    Spouse name: N/A  . Number of children: N/A  . Years of education: N/A   Social History Main Topics  . Smoking status: Former Smoker    Quit date: 06/30/2015  . Smokeless tobacco: Never Used  . Alcohol use 0.0 oz/week     Comment: very rare  . Drug use:     Types: Marijuana     Comment: 3-4 times per week  . Sexual activity: Not Asked   Other Topics Concern  . None   Social History Narrative  . None   Review of Systems - See HPI.  All other ROS are negative.  BP 98/60  Pulse 68   Temp 97.9 F (36.6 C) (Oral)   Resp 14   Ht 5\' 4"  (1.626 m)   Wt 165 lb (74.8 kg)   SpO2 98%   BMI 28.32 kg/m   Physical Exam  Constitutional: She is oriented to person, place, and time and well-developed, well-nourished, and in no distress.  HENT:  Head: Normocephalic and atraumatic.  Eyes: Conjunctivae are normal.  Cardiovascular: Normal rate, regular rhythm, normal heart sounds and intact distal pulses.   Pulmonary/Chest: Effort normal and breath sounds normal. No respiratory distress. She has no wheezes. She has no rales. She exhibits no tenderness.  Neurological: She is alert and oriented to person, place, and time.  Skin: Skin is warm and dry. No rash noted.  Psychiatric: Affect normal.  Vitals reviewed.  Assessment/Plan: Bipolar disorder Mixed. No manic episodes. Stable on current regimen of Depakote, Abilify and  Citalopram. Will repeat valproic acid levels today. Will also check CBC and CMP for therapeutic drug monitoring as patient is due. FU discussed. Will alter regimen if indicated by lab results. Patient is scheduling a CPE.     , PA-C

## 2016-09-28 NOTE — Patient Instructions (Signed)
Please go to the lab for blood work. I will call you with your results. I am glad you have done so well on this regimen.  Please schedule an appointment for a complete physical. We will follow-up every 6 months for the Bipolar Disorder

## 2016-09-28 NOTE — Assessment & Plan Note (Signed)
Mixed. No manic episodes. Stable on current regimen of Depakote, Abilify and Citalopram. Will repeat valproic acid levels today. Will also check CBC and CMP for therapeutic drug monitoring as patient is due. FU discussed. Will alter regimen if indicated by lab results. Patient is scheduling a CPE.

## 2016-09-28 NOTE — Progress Notes (Signed)
Pre visit review using our clinic review tool, if applicable. No additional management support is needed unless otherwise documented below in the visit note. 

## 2016-09-29 LAB — VALPROIC ACID LEVEL: Valproic Acid Lvl: 60 ug/mL (ref 50.0–100.0)

## 2016-10-01 NOTE — Progress Notes (Signed)
Called pt and lmovm to return call.

## 2016-10-16 ENCOUNTER — Other Ambulatory Visit: Payer: Self-pay | Admitting: Physician Assistant

## 2016-10-16 NOTE — Telephone Encounter (Signed)
Last OV 09/28/16 depakote last filled 09/17/16 #90 with 0  depakote level completed on 09/28/16, per PCP needs to be checked every 3 months.

## 2016-11-01 ENCOUNTER — Other Ambulatory Visit: Payer: Self-pay | Admitting: Physician Assistant

## 2016-11-02 ENCOUNTER — Ambulatory Visit (INDEPENDENT_AMBULATORY_CARE_PROVIDER_SITE_OTHER): Payer: Commercial Managed Care - PPO | Admitting: Physician Assistant

## 2016-11-02 ENCOUNTER — Telehealth: Payer: Self-pay | Admitting: Physician Assistant

## 2016-11-02 ENCOUNTER — Encounter: Payer: Self-pay | Admitting: Physician Assistant

## 2016-11-02 VITALS — BP 118/70 | HR 67 | Temp 98.4°F | Resp 14 | Ht 64.0 in | Wt 166.0 lb

## 2016-11-02 DIAGNOSIS — F3161 Bipolar disorder, current episode mixed, mild: Secondary | ICD-10-CM

## 2016-11-02 DIAGNOSIS — Z0000 Encounter for general adult medical examination without abnormal findings: Secondary | ICD-10-CM

## 2016-11-02 DIAGNOSIS — Z7689 Persons encountering health services in other specified circumstances: Secondary | ICD-10-CM | POA: Diagnosis not present

## 2016-11-02 DIAGNOSIS — M069 Rheumatoid arthritis, unspecified: Secondary | ICD-10-CM

## 2016-11-02 DIAGNOSIS — Z1159 Encounter for screening for other viral diseases: Secondary | ICD-10-CM

## 2016-11-02 LAB — CBC
HCT: 37 % (ref 36.0–46.0)
HEMOGLOBIN: 12.3 g/dL (ref 12.0–15.0)
MCHC: 33.3 g/dL (ref 30.0–36.0)
MCV: 92.4 fl (ref 78.0–100.0)
Platelets: 220 10*3/uL (ref 150.0–400.0)
RBC: 4.01 Mil/uL (ref 3.87–5.11)
RDW: 12.3 % (ref 11.5–15.5)
WBC: 6.3 10*3/uL (ref 4.0–10.5)

## 2016-11-02 LAB — URINALYSIS, ROUTINE W REFLEX MICROSCOPIC
BILIRUBIN URINE: NEGATIVE
HGB URINE DIPSTICK: NEGATIVE
Ketones, ur: NEGATIVE
LEUKOCYTES UA: NEGATIVE
NITRITE: NEGATIVE
PH: 7.5 (ref 5.0–8.0)
Specific Gravity, Urine: 1.01 (ref 1.000–1.030)
TOTAL PROTEIN, URINE-UPE24: NEGATIVE
URINE GLUCOSE: NEGATIVE
Urobilinogen, UA: 0.2 (ref 0.0–1.0)

## 2016-11-02 LAB — COMPREHENSIVE METABOLIC PANEL
ALT: 11 U/L (ref 0–35)
AST: 12 U/L (ref 0–37)
Albumin: 4.2 g/dL (ref 3.5–5.2)
Alkaline Phosphatase: 37 U/L — ABNORMAL LOW (ref 39–117)
BILIRUBIN TOTAL: 0.5 mg/dL (ref 0.2–1.2)
BUN: 14 mg/dL (ref 6–23)
CO2: 33 meq/L — AB (ref 19–32)
Calcium: 9.5 mg/dL (ref 8.4–10.5)
Chloride: 104 mEq/L (ref 96–112)
Creatinine, Ser: 0.73 mg/dL (ref 0.40–1.20)
GFR: 87.75 mL/min (ref >60.00)
GLUCOSE: 87 mg/dL (ref 70–99)
Potassium: 4.4 mEq/L (ref 3.5–5.1)
SODIUM: 142 meq/L (ref 135–145)
Total Protein: 7.1 g/dL (ref 6.0–8.3)

## 2016-11-02 LAB — LIPID PANEL
CHOL/HDL RATIO: 3
Cholesterol: 203 mg/dL — ABNORMAL HIGH (ref 0–200)
HDL: 62.1 mg/dL (ref >39.00)
LDL Cholesterol: 123 mg/dL — ABNORMAL HIGH (ref 0–99)
NONHDL: 141.2
Triglycerides: 92 mg/dL (ref 0.0–149.0)
VLDL: 18.4 mg/dL (ref 0.0–40.0)

## 2016-11-02 LAB — HEMOGLOBIN A1C: HEMOGLOBIN A1C: 5.2 % (ref 4.6–6.5)

## 2016-11-02 LAB — TSH: TSH: 3.01 u[IU]/mL (ref 0.35–4.50)

## 2016-11-02 NOTE — Progress Notes (Signed)
Pre visit review using our clinic review tool, if applicable. No additional management support is needed unless otherwise documented below in the visit note. 

## 2016-11-02 NOTE — Progress Notes (Signed)
Patient presents to clinic today for annual exam.  Patient is fasting for labs. Body mass index is 28.49 kg/m. No current exercise regimen. Has been eating poorly over the holidays.   Acute Concerns: Denies acute concerns at today's visit.   Chronic Issues: Bipolar Disorder -- Is currently on daily Citalopram, Abilify. Also taking Depakote BID. Recent level within normal limits. Endorses doing well on current regimen. Denies side effects of medication. Denies depressed mood or manic episode. Rare anxiety with medication.   Health Maintenance: Immunizations -- Declines flu shot. Tetanus up-to-date. Colonoscopy -- up-to-date Mammogram -- Last Mammogram 2017. Is scheduling repeat.  PAP -- s/p hysterectomy   Past Medical History:  Diagnosis Date  . Chicken pox   . Chronic rheumatic arthritis ([place])   . GERD (gastroesophageal reflux disease)   . Hemorrhoids   . Hyperlipidemia    Borderline  . Mumps   . RLS (restless legs syndrome)   . Urinary incontinence    Leakage  . UTI (lower urinary tract infection)     Past Surgical History:  Procedure Laterality Date  . ABDOMINAL HYSTERECTOMY  2013  . BREAST ENHANCEMENT SURGERY  2005/1992  . FOOT SURGERY     Left, [place]  . TONSILLECTOMY AND ADENOIDECTOMY  1972    Current Outpatient Prescriptions on File Prior to Visit  Medication Sig Dispense Refill  . acyclovir (ZOVIRAX) 800 MG tablet TAKE 1 TABLET(800 MG) BY MOUTH DAILY 45 tablet 0  . ALPRAZolam (XANAX) 0.5 MG tablet Take 0.5 mg by mouth as needed for anxiety. Reported on 10/28/2015    . ARIPiprazole (ABILIFY) 5 MG tablet TAKE 1 TABLET BY MOUTH DAILY 30 tablet 2  . Ascorbic Acid (VITAMIN C) 1000 MG tablet Take 1,000 mg by mouth daily.    . Bacillus Coagulans-Inulin (PROBIOTIC FORMULA) 1-250 BILLION-MG CAPS Take by mouth daily.    . citalopram (CELEXA) 20 MG tablet TAKE 1 TABLET BY MOUTH DAILY 90 tablet 0  . Cranberry-Vitamin C 84-20 MG CAPS Take by mouth daily.    . diclofenac  sodium (VOLTAREN) 1 % GEL Apply 2 g topically 4 (four) times daily. (Patient taking differently: Apply 2 g topically 4 (four) times daily as needed. ) 100 g 3  . divalproex (DEPAKOTE) 250 MG DR tablet TAKE 1 TABLET(250 MG) BY MOUTH THREE TIMES DAILY. FOLLOW-UP BEFORE FURTHER REFILLS 90 tablet 0  . Multiple Vitamins-Minerals (CENTRUM ADULTS) TABS Take by mouth daily.     No current facility-administered medications on file prior to visit.     Allergies  Allergen Reactions  . Lithium Other (See Comments)    Boils  . Penicillins Hives    Family History  Problem Relation Age of Onset  . Mental illness Mother     Living  . Dementia Mother   . Parkinson's disease Father     Deceased  . Alzheimer's disease Father   . Cancer Maternal Grandmother   . Diabetes Maternal Uncle   . Arthritis Maternal Aunt     Psoriatic   . Alcohol abuse Brother   . Healthy Brother   . Healthy Sister   . Allergies Daughter   . Healthy Daughter   . Healthy Son     x2  . Colon cancer Neg Hx     Social History   Social History  . Marital status: Married    Spouse name: N/A  . Number of children: N/A  . Years of education: N/A   Occupational History  . Not on  file.   Social History Main Topics  . Smoking status: Former Smoker    Quit date: 06/30/2015  . Smokeless tobacco: Never Used  . Alcohol use 0.0 oz/week     Comment: very rare  . Drug use:     Types: Marijuana     Comment: 3-4 times per week  . Sexual activity: Not on file   Other Topics Concern  . Not on file   Social History Narrative  . No narrative on file    Review of Systems  Constitutional: Negative for fever and weight loss.  HENT: Negative for ear discharge, ear pain, hearing loss and tinnitus.   Eyes: Negative for blurred vision, double vision, photophobia and pain.  Respiratory: Negative for cough and shortness of breath.   Cardiovascular: Negative for chest pain and palpitations.  Gastrointestinal: Negative for  abdominal pain, blood in stool, constipation, diarrhea, heartburn, melena, nausea and vomiting.  Genitourinary: Negative for dysuria, flank pain, frequency, hematuria and urgency.  Musculoskeletal: Negative for falls.  Neurological: Negative for dizziness, loss of consciousness and headaches.  Endo/Heme/Allergies: Negative for environmental allergies.  Psychiatric/Behavioral: Negative for depression, hallucinations, substance abuse and suicidal ideas. The patient is not nervous/anxious and does not have insomnia.    BP 118/70   Pulse 67   Temp 98.4 F (36.9 C) (Oral)   Resp 14   Ht 5' 4"  (1.626 m)   Wt 166 lb (75.3 kg)   SpO2 98%   BMI 28.49 kg/m   Physical Exam  Constitutional: She is oriented to person, place, and time and well-developed, well-nourished, and in no distress.  HENT:  Head: Normocephalic and atraumatic.  Right Ear: Tympanic membrane, external ear and ear canal normal.  Left Ear: Tympanic membrane, external ear and ear canal normal.  Nose: Nose normal. No mucosal edema.  Mouth/Throat: Uvula is midline, oropharynx is clear and moist and mucous membranes are normal. No oropharyngeal exudate or posterior oropharyngeal erythema.  Eyes: Conjunctivae are normal. Pupils are equal, round, and reactive to light.  Neck: Neck supple. No thyromegaly present.  Cardiovascular: Normal rate, regular rhythm, normal heart sounds and intact distal pulses.   Pulmonary/Chest: Effort normal and breath sounds normal. No respiratory distress. She has no wheezes. She has no rales.  Abdominal: Soft. Bowel sounds are normal. She exhibits no distension and no mass. There is no tenderness. There is no rebound and no guarding.  Lymphadenopathy:    She has no cervical adenopathy.  Neurological: She is alert and oriented to person, place, and time. No cranial nerve deficit.  Skin: Skin is warm and dry. No rash noted.  Psychiatric: Affect normal.  Vitals reviewed.  Recent Results (from the past  2160 hour(s))  CBC     Status: None   Collection Time: 09/28/16  9:33 AM  Result Value Ref Range   WBC 5.5 4.0 - 10.5 K/uL   RBC 4.07 3.87 - 5.11 Mil/uL   Platelets 192.0 150.0 - 400.0 K/uL   Hemoglobin 12.6 12.0 - 15.0 g/dL   HCT 37.2 36.0 - 46.0 %   MCV 91.6 78.0 - 100.0 fl   MCHC 34.0 30.0 - 36.0 g/dL   RDW 12.8 11.5 - 15.5 %  Comp Met (CMET)     Status: Abnormal   Collection Time: 09/28/16  9:33 AM  Result Value Ref Range   Sodium 143 135 - 145 mEq/L   Potassium 4.5 3.5 - 5.1 mEq/L   Chloride 104 96 - 112 mEq/L   CO2 32  19 - 32 mEq/L   Glucose, Bld 78 70 - 99 mg/dL   BUN 15 6 - 23 mg/dL   Creatinine, Ser 0.76 0.40 - 1.20 mg/dL   Total Bilirubin 0.5 0.2 - 1.2 mg/dL   Alkaline Phosphatase 36 (L) 39 - 117 U/L   AST 14 0 - 37 U/L   ALT 17 0 - 35 U/L   Total Protein 7.0 6.0 - 8.3 g/dL   Albumin 4.2 3.5 - 5.2 g/dL   Calcium 9.6 8.4 - 10.5 mg/dL   GFR 83.79 >60.00 mL/min  Valproic Acid level     Status: None   Collection Time: 09/28/16  9:33 AM  Result Value Ref Range   Valproic Acid Lvl 60.0 50.0 - 100.0 ug/mL    Assessment/Plan: No problem-specific Assessment & Plan notes found for this encounter.    Leeanne Rio, PA-C

## 2016-11-02 NOTE — Telephone Encounter (Signed)
Advised Stephanie Spencer her insurance will cover the Hep C screening. Hep C ordered and waiting on results

## 2016-11-02 NOTE — Patient Instructions (Signed)
Please go to the lab for blood work.   Our office will call you with your results unless you have chosen to receive results via MyChart.  If your blood work is normal we will follow-up each year for physicals and as scheduled for chronic medical problems.  If anything is abnormal we will treat accordingly and get you in for a follow-up.  Please continue medications as directed.  Follow-up in 3 months. Please return sooner if needed.  You will be contacted for assessment by GYN. Do not forget to schedule your repeat mammogram.   Preventive Care 40-64 Years, Female Preventive care refers to lifestyle choices and visits with your health care provider that can promote health and wellness. What does preventive care include?  A yearly physical exam. This is also called an annual well check.  Dental exams once or twice a year.  Routine eye exams. Ask your health care provider how often you should have your eyes checked.  Personal lifestyle choices, including:  Daily care of your teeth and gums.  Regular physical activity.  Eating a healthy diet.  Avoiding tobacco and drug use.  Limiting alcohol use.  Practicing safe sex.  Taking low-dose aspirin daily starting at age 28.  Taking vitamin and mineral supplements as recommended by your health care provider. What happens during an annual well check? The services and screenings done by your health care provider during your annual well check will depend on your age, overall health, lifestyle risk factors, and family history of disease. Counseling  Your health care provider may ask you questions about your:  Alcohol use.  Tobacco use.  Drug use.  Emotional well-being.  Home and relationship well-being.  Sexual activity.  Eating habits.  Work and work Statistician.  Method of birth control.  Menstrual cycle.  Pregnancy history. Screening  You may have the following tests or measurements:  Height, weight, and  BMI.  Blood pressure.  Lipid and cholesterol levels. These may be checked every 5 years, or more frequently if you are over 71 years old.  Skin check.  Lung cancer screening. You may have this screening every year starting at age 59 if you have a 30-pack-year history of smoking and currently smoke or have quit within the past 15 years.  Fecal occult blood test (FOBT) of the stool. You may have this test every year starting at age 58.  Flexible sigmoidoscopy or colonoscopy. You may have a sigmoidoscopy every 5 years or a colonoscopy every 10 years starting at age 32.  Hepatitis C blood test.  Hepatitis B blood test.  Sexually transmitted disease (STD) testing.  Diabetes screening. This is done by checking your blood sugar (glucose) after you have not eaten for a while (fasting). You may have this done every 1-3 years.  Mammogram. This may be done every 1-2 years. Talk to your health care provider about when you should start having regular mammograms. This may depend on whether you have a family history of breast cancer.  BRCA-related cancer screening. This may be done if you have a family history of breast, ovarian, tubal, or peritoneal cancers.  Pelvic exam and Pap test. This may be done every 3 years starting at age 54. Starting at age 52, this may be done every 5 years if you have a Pap test in combination with an HPV test.  Bone density scan. This is done to screen for osteoporosis. You may have this scan if you are at high risk for osteoporosis. Discuss  your test results, treatment options, and if necessary, the need for more tests with your health care provider. Vaccines  Your health care provider may recommend certain vaccines, such as:  Influenza vaccine. This is recommended every year.  Tetanus, diphtheria, and acellular pertussis (Tdap, Td) vaccine. You may need a Td booster every 10 years.  Varicella vaccine. You may need this if you have not been vaccinated.  Zoster  vaccine. You may need this after age 57.  Measles, mumps, and rubella (MMR) vaccine. You may need at least one dose of MMR if you were born in 1957 or later. You may also need a second dose.  Pneumococcal 13-valent conjugate (PCV13) vaccine. You may need this if you have certain conditions and were not previously vaccinated.  Pneumococcal polysaccharide (PPSV23) vaccine. You may need one or two doses if you smoke cigarettes or if you have certain conditions.  Meningococcal vaccine. You may need this if you have certain conditions.  Hepatitis A vaccine. You may need this if you have certain conditions or if you travel or work in places where you may be exposed to hepatitis A.  Hepatitis B vaccine. You may need this if you have certain conditions or if you travel or work in places where you may be exposed to hepatitis B.  Haemophilus influenzae type b (Hib) vaccine. You may need this if you have certain conditions. Talk to your health care provider about which screenings and vaccines you need and how often you need them. This information is not intended to replace advice given to you by your health care provider. Make sure you discuss any questions you have with your health care provider. Document Released: 11/11/2015 Document Revised: 07/04/2016 Document Reviewed: 08/16/2015 Elsevier Interactive Patient Education  2017 [employer].

## 2016-11-02 NOTE — Telephone Encounter (Signed)
Husband states insurance will cover the Hep C testing.

## 2016-11-03 LAB — HEPATITIS C ANTIBODY: HCV AB: NEGATIVE

## 2016-11-03 NOTE — Assessment & Plan Note (Signed)
Patient doing very well. Depakote level good. Continue current medication regimen. FU every 3 months.

## 2016-11-03 NOTE — Assessment & Plan Note (Signed)
Depression screen negative. Health Maintenance reviewed -- Declines flu shot. Tetanus up-to-date, colonoscopy up-to-date. Repeat mammogram scheduled. Referral to GYN placed for routine care at patient preference.  Preventive schedule discussed and handout given in AVS. Will obtain fasting labs today.

## 2016-11-03 NOTE — Assessment & Plan Note (Addendum)
Followed by Rheumatology. Not currently on medication. Denies symptoms at present. Is scheduling FU with specialist.

## 2016-11-08 ENCOUNTER — Encounter: Payer: Self-pay | Admitting: Emergency Medicine

## 2016-11-16 ENCOUNTER — Other Ambulatory Visit: Payer: Self-pay | Admitting: Physician Assistant

## 2016-11-29 ENCOUNTER — Other Ambulatory Visit: Payer: Self-pay | Admitting: Physician Assistant

## 2016-12-10 LAB — HM PAP SMEAR: HM Pap smear: NORMAL

## 2016-12-10 LAB — HM COLONOSCOPY

## 2016-12-14 ENCOUNTER — Ambulatory Visit (INDEPENDENT_AMBULATORY_CARE_PROVIDER_SITE_OTHER): Payer: Commercial Managed Care - PPO | Admitting: Obstetrics & Gynecology

## 2016-12-14 ENCOUNTER — Encounter: Payer: Commercial Managed Care - PPO | Admitting: Obstetrics & Gynecology

## 2016-12-14 ENCOUNTER — Encounter: Payer: Self-pay | Admitting: Obstetrics & Gynecology

## 2016-12-14 VITALS — BP 124/63 | HR 79 | Ht 63.5 in | Wt 165.0 lb

## 2016-12-14 DIAGNOSIS — Z01419 Encounter for gynecological examination (general) (routine) without abnormal findings: Secondary | ICD-10-CM

## 2016-12-14 DIAGNOSIS — Z1231 Encounter for screening mammogram for malignant neoplasm of breast: Secondary | ICD-10-CM

## 2016-12-14 DIAGNOSIS — Z0000 Encounter for general adult medical examination without abnormal findings: Secondary | ICD-10-CM | POA: Diagnosis not present

## 2016-12-14 DIAGNOSIS — N898 Other specified noninflammatory disorders of vagina: Secondary | ICD-10-CM

## 2016-12-14 NOTE — Progress Notes (Signed)
Subjective:     Stephanie Spencer is a 56 y.o. female here for a routine exam. [id number]  LMP 4 years Current complaints:  Moved here from [place] 3 years. Pt denies GYN problems.  Pt reports a potential odor.  She reports that these sx have been present for ~6 month.  +gential HSV.  Pt reports no recent outbreaks > 1year. NO FH of breast or vo cancer.  Pt lived in [place] and worked at the [employer] [place]   Gynecologic History No LMP recorded. Patient has had a hysterectomy. 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 Farris Has  Had a +HPV Last Pap: 2013 Results were: normal Last mammogram: 09/2015.  Results were: normal  The following portions of the patient's history were reviewed and updated as appropriate: allergies, current medications, past family history, past medical history, past social history, past surgical history and problem list.  Review of Systems Pertinent items are noted in HPI.    Objective:  BP 124/63 (BP Location: Left Arm)   Pulse 79   Ht 5' 3.5" (1.613 m)   Wt 165 lb (74.8 kg)   BMI 28.77 kg/m   General Appearance:    Alert, cooperative, no distress, appears stated age  Head:    Normocephalic, without obvious abnormality, atraumatic  Eyes:    conjunctiva/corneas clear, EOM's intact, both eyes  Ears:    Normal external ear canals, both ears  Nose:   Nares normal, septum midline, mucosa normal, no drainage    or sinus tenderness  Throat:   Lips, mucosa, and tongue normal; teeth and gums normal  Neck:   Supple, symmetrical, trachea midline, no adenopathy;    thyroid:  no enlargement/tenderness/nodules  Back:     Symmetric, no curvature, ROM normal, no CVA tenderness  Lungs:     Clear to auscultation bilaterally, respirations unlabored  Chest Wall:    No tenderness or deformity   Heart:    Regular rate and rhythm, S1 and S2 normal, no murmur, rub   or gallop  Breast Exam:    No tenderness, masses, or nipple abnormality- bilateral implants noted.   Abdomen:     Soft, non-tender, bowel sounds active all four quadrants,    no masses, no organomegaly  Genitalia:    Normal female without lesion, discharge or tenderness; no lesions noted. The labia looks a bit erythematous no other adverse changes noted.      Extremities:   Extremities normal, atraumatic, no cyanosis or edema  Pulses:   2+ and symmetric all extremities  Skin:   Skin color, texture, turgor normal, no rashes or lesions      Assessment:    Healthy female exam.   Breast cancer screening Decreased libido vs GSM   Plan:    Mammogram ordered.    Acyclovir 800mg  q day Rec lubrication with intercourse. rec f/u to discuss libido at a subsequent visit prn  Stephanie Spencer, M.D., FACOG  Pts husband was unhappy during the visit and reports that he will not come back. Pt reports that she was very satisfied with her visit.  She was encourage to f/u in 1 year or sooner prn.

## 2016-12-16 ENCOUNTER — Other Ambulatory Visit: Payer: Self-pay | Admitting: Physician Assistant

## 2016-12-17 LAB — CERVICOVAGINAL ANCILLARY ONLY: WET PREP (BD AFFIRM): NEGATIVE

## 2016-12-18 ENCOUNTER — Ambulatory Visit (HOSPITAL_BASED_OUTPATIENT_CLINIC_OR_DEPARTMENT_OTHER)
Admission: RE | Admit: 2016-12-18 | Discharge: 2016-12-18 | Disposition: A | Discharge status: Home / Self Care | Payer: Commercial Managed Care - PPO | Source: Ambulatory Visit | Attending: Obstetrics & Gynecology | Admitting: Obstetrics & Gynecology

## 2016-12-18 ENCOUNTER — Encounter (HOSPITAL_BASED_OUTPATIENT_CLINIC_OR_DEPARTMENT_OTHER): Payer: Self-pay

## 2016-12-18 DIAGNOSIS — Z9882 Breast implant status: Secondary | ICD-10-CM | POA: Insufficient documentation

## 2016-12-18 DIAGNOSIS — Z1231 Encounter for screening mammogram for malignant neoplasm of breast: Secondary | ICD-10-CM | POA: Insufficient documentation

## 2017-03-02 ENCOUNTER — Other Ambulatory Visit: Payer: Self-pay | Admitting: Physician Assistant

## 2017-05-02 ENCOUNTER — Other Ambulatory Visit: Payer: Self-pay | Admitting: Physician Assistant

## 2017-05-03 ENCOUNTER — Encounter: Payer: Self-pay | Admitting: Physician Assistant

## 2017-05-03 ENCOUNTER — Ambulatory Visit (INDEPENDENT_AMBULATORY_CARE_PROVIDER_SITE_OTHER): Payer: Commercial Managed Care - PPO | Admitting: Physician Assistant

## 2017-05-03 VITALS — BP 112/76 | HR 78 | Temp 98.4°F | Resp 14 | Ht 64.0 in | Wt 155.0 lb

## 2017-05-03 DIAGNOSIS — F3177 Bipolar disorder, in partial remission, most recent episode mixed: Secondary | ICD-10-CM

## 2017-05-03 LAB — TSH: TSH: 2.46 u[IU]/mL (ref 0.35–4.50)

## 2017-05-03 MED ORDER — ALPRAZOLAM 0.25 MG PO TABS: 0.2500 mg | ORAL_TABLET | Freq: Two times a day (BID) | ORAL | 0 refills | Status: DC | PRN

## 2017-05-03 NOTE — Patient Instructions (Signed)
Please go to the lab for blood work. I will call you with your results.  Continue medications as directed. Check with insurance to see if they will cover visits with counselor. I am also looking into resources for you.  Keep the handout given so we can get something scheduled for you.   Please increase fluid. Start a daily probiotic. Consider starting OTC Zanatac 75 mg twice daily for 1 week to help with any excess acid production.  Follow-up will be based on lab results.

## 2017-05-03 NOTE — Assessment & Plan Note (Signed)
Doing well overall. Some breakthrough anxiety that is situational. No alarm signs/symptoms. Discussed counseling. Handout given. Denies change in medication today. Repeat Depakote level and TSH today.

## 2017-05-03 NOTE — Progress Notes (Signed)
Patient presents to clinic today for follow-up of Bipolar Disorder I. Patient is currently on a regimen of Depakote ER 250 mg taking BID, Abilify 5 mg QD and Citalopram 20 mg daily. Has done very well on this regimen previously. Patient endorses still doing well overall. Has noted some mild anxiety over the past week. States family recently visited and she always feels anxious/down in the dumps when they leave. Does not get to see her family often. Patient denies SI/HI or manic behavior.    Past Medical History:  Diagnosis Date  . Chicken pox   . Chronic rheumatic arthritis ([facility])   . GERD (gastroesophageal reflux disease)   . Hemorrhoids   . Hyperlipidemia    Borderline  . Mumps   . RLS (restless legs syndrome)   . Urinary incontinence    Leakage  . UTI (lower urinary tract infection)     Current Outpatient Prescriptions on File Prior to Visit  Medication Sig Dispense Refill  . acyclovir (ZOVIRAX) 800 MG tablet TAKE 1 TABLET(800 MG) BY MOUTH DAILY 45 tablet 0  . ARIPiprazole (ABILIFY) 5 MG tablet TAKE 1 TABLET BY MOUTH DAILY 30 tablet 3  . Ascorbic Acid (VITAMIN C) 1000 MG tablet Take 1,000 mg by mouth daily.    . Bacillus Coagulans-Inulin (PROBIOTIC FORMULA) 1-250 BILLION-MG CAPS Take by mouth daily.    . citalopram (CELEXA) 20 MG tablet TAKE 1 TABLET BY MOUTH DAILY 90 tablet 1  . Cranberry-Vitamin C 84-20 MG CAPS Take by mouth daily.    . divalproex (DEPAKOTE) 250 MG DR tablet TAKE 1 TABLET(250 MG) BY MOUTH THREE TIMES DAILY. FOLLOW-UP BEFORE FURTHER REFILLS (Patient taking differently: TAKE 1 TABLET BY MOUTH TWICE DAILY) 90 tablet 2  . Multiple Vitamins-Minerals (CENTRUM ADULTS) TABS Take by mouth daily.     No current facility-administered medications on file prior to visit.     Allergies  Allergen Reactions  . Lithium Other (See Comments)    Boils  . Penicillins Hives    Family History  Problem Relation Age of Onset  . Mental illness Mother        Living  . Dementia  Mother   . Parkinson's disease Father        Deceased  . Alzheimer's disease Father   . Cancer Maternal Grandmother   . Diabetes Maternal Uncle   . Arthritis Maternal Aunt        Psoriatic   . Alcohol abuse Brother   . Healthy Brother   . Healthy Sister   . Allergies Daughter   . Healthy Daughter   . Healthy Son        x2  . Colon cancer Neg Hx     Social History   Social History  . Marital status: Married    Spouse name: N/A  . Number of children: N/A  . Years of education: N/A   Social History Main Topics  . Smoking status: Former Smoker    Quit date: 06/30/2015  . Smokeless tobacco: Never Used  . Alcohol use 0.0 oz/week     Comment: very rare  . Drug use: Yes    Types: Marijuana     Comment: 3-4 times per week  . Sexual activity: Not Asked   Other Topics Concern  . None   Social History Narrative  . None   Review of Systems - See HPI.  All other ROS are negative.  BP 112/76   Pulse 78   Temp 98.4 F (36.9 C) (  Oral)   Resp 14   Ht 5\' 4"  (1.626 m)   Wt 155 lb (70.3 kg)   SpO2 99%   BMI 26.61 kg/m   Physical Exam  Constitutional: She is oriented to person, place, and time and well-developed, well-nourished, and in no distress.  HENT:  Head: Normocephalic and atraumatic.  Eyes: Conjunctivae are normal.  Neck: Neck supple.  Cardiovascular: Normal rate, regular rhythm, normal heart sounds and intact distal pulses.   Pulmonary/Chest: Effort normal and breath sounds normal. No respiratory distress. She has no wheezes. She has no rales. She exhibits no tenderness.  Neurological: She is alert and oriented to person, place, and time.  Skin: Skin is warm and dry. No rash noted.  Psychiatric: Affect normal.  Vitals reviewed.  Assessment/Plan: Bipolar disorder Doing well overall. Some breakthrough anxiety that is situational. No alarm signs/symptoms. Discussed counseling. Handout given. Denies change in medication today. Repeat Depakote level and TSH today.       , PA-C

## 2017-05-03 NOTE — Progress Notes (Signed)
Pre visit review using our clinic review tool, if applicable. No additional management support is needed unless otherwise documented below in the visit note. 

## 2017-05-04 LAB — VALPROIC ACID LEVEL: Valproic Acid Lvl: 51.9 mg/L (ref 50.0–100.0)

## 2017-06-12 ENCOUNTER — Other Ambulatory Visit: Payer: Self-pay | Admitting: Physician Assistant

## 2017-06-14 ENCOUNTER — Ambulatory Visit: Payer: Self-pay | Admitting: Psychology

## 2017-09-13 ENCOUNTER — Other Ambulatory Visit: Payer: Self-pay | Admitting: Physician Assistant

## 2017-10-24 ENCOUNTER — Telehealth: Payer: Self-pay | Admitting: Physician Assistant

## 2017-10-24 NOTE — Telephone Encounter (Signed)
CRM for notification. See Telephone encounter for:   10/24/17.   [id number] - [place], [place] - [address] AT [address] [phone] (Phone) [phone] (Fax)   Pharmacy checking on the status of citalopram (CELEXA) 20 MG tablet refill, please advise

## 2017-10-24 NOTE — Telephone Encounter (Signed)
LM for patient to call to schedule appointment.  Appointment needed before refill can be given.   CRM created for [facility] to schedule appt when patient calls back.

## 2017-11-08 ENCOUNTER — Encounter: Payer: Self-pay | Admitting: Physician Assistant

## 2017-11-08 ENCOUNTER — Ambulatory Visit: Payer: Commercial Managed Care - PPO | Admitting: Physician Assistant

## 2017-11-08 ENCOUNTER — Other Ambulatory Visit: Payer: Self-pay

## 2017-11-08 VITALS — BP 100/60 | HR 65 | Temp 97.9°F | Resp 14 | Ht 64.0 in | Wt 158.0 lb

## 2017-11-08 DIAGNOSIS — F3177 Bipolar disorder, in partial remission, most recent episode mixed: Secondary | ICD-10-CM | POA: Diagnosis not present

## 2017-11-08 LAB — COMPREHENSIVE METABOLIC PANEL
ALBUMIN: 4.2 g/dL (ref 3.5–5.2)
ALK PHOS: 31 U/L — AB (ref 39–117)
ALT: 15 U/L (ref 0–35)
AST: 12 U/L (ref 0–37)
BILIRUBIN TOTAL: 0.4 mg/dL (ref 0.2–1.2)
BUN: 12 mg/dL (ref 6–23)
CALCIUM: 9.6 mg/dL (ref 8.4–10.5)
CHLORIDE: 101 meq/L (ref 96–112)
CO2: 36 mEq/L — ABNORMAL HIGH (ref 19–32)
CREATININE: 0.75 mg/dL (ref 0.40–1.20)
GFR: 84.74 mL/min (ref >60.00)
Glucose, Bld: 59 mg/dL — ABNORMAL LOW (ref 70–99)
Potassium: 4.3 mEq/L (ref 3.5–5.1)
SODIUM: 143 meq/L (ref 135–145)
Total Protein: 7 g/dL (ref 6.0–8.3)

## 2017-11-08 LAB — TSH: TSH: 2.5 u[IU]/mL (ref 0.35–4.50)

## 2017-11-08 NOTE — Progress Notes (Signed)
Patient presents to clinic today for follow-up of bipolar disorder. Patient currently on a regimen of Abilify, Depakote BID, and Citalopram. Is taking as directed and has done extremely well with this combination. Denies depressed mood or anhedonia. Denies manic episodes. Denies SI/HI. Diet is well-balanced but no regular exercise. Is overdue for Depakote level.  Past Medical History:  Diagnosis Date  . Chicken pox   . Chronic rheumatic arthritis (HCC)   . GERD (gastroesophageal reflux disease)   . Hemorrhoids   . Hyperlipidemia    Borderline  . Mumps   . RLS (restless legs syndrome)   . Urinary incontinence    Leakage  . UTI (lower urinary tract infection)     Current Outpatient Medications on File Prior to Visit  Medication Sig Dispense Refill  . acyclovir (ZOVIRAX) 800 MG tablet TAKE 1 TABLET(800 MG) BY MOUTH DAILY 30 tablet 3  . ALPRAZolam (XANAX) 0.25 MG tablet Take 1 tablet (0.25 mg total) by mouth 2 (two) times daily as needed for anxiety. 10 tablet 0  . ARIPiprazole (ABILIFY) 5 MG tablet TAKE 1 TABLET BY MOUTH DAILY 30 tablet 0  . Ascorbic Acid (VITAMIN C) 1000 MG tablet Take 1,000 mg by mouth daily.    . Bacillus Coagulans-Inulin (PROBIOTIC FORMULA) 1-250 BILLION-MG CAPS Take by mouth daily.    . citalopram (CELEXA) 20 MG tablet TAKE 1 TABLET BY MOUTH DAILY 30 tablet 0  . Cranberry-Vitamin C 84-20 MG CAPS Take by mouth daily.    . divalproex (DEPAKOTE) 250 MG DR tablet TAKE 1 TABLET(250 MG) BY MOUTH THREE TIMES DAILY. FOLLOW-UP BEFORE FURTHER REFILLS 90 tablet 0  . Multiple Vitamins-Minerals (CENTRUM ADULTS) TABS Take by mouth daily.     No current facility-administered medications on file prior to visit.     Allergies  Allergen Reactions  . Lithium Other (See Comments)    Boils  . Penicillins Hives    Family History  Problem Relation Age of Onset  . Mental illness Mother        Living  . Dementia Mother   . Parkinson's disease Father        Deceased  .  Alzheimer's disease Father   . Cancer Maternal Grandmother   . Diabetes Maternal Uncle   . Arthritis Maternal Aunt        Psoriatic   . Alcohol abuse Brother   . Healthy Brother   . Healthy Sister   . Allergies Daughter   . Healthy Daughter   . Healthy Son        x2  . Colon cancer Neg Hx     Social History   Socioeconomic History  . Marital status: Married    Spouse name: None  . Number of children: None  . Years of education: None  . Highest education level: None  Social Needs  . Financial resource strain: None  . Food insecurity - worry: None  . Food insecurity - inability: None  . Transportation needs - medical: None  . Transportation needs - non-medical: None  Occupational History  . None  Tobacco Use  . Smoking status: Former Smoker    Last attempt to quit: 06/30/2015    Years since quitting: 2.3  . Smokeless tobacco: Never Used  Substance and Sexual Activity  . Alcohol use: Yes    Alcohol/week: 0.0 oz    Comment: very rare  . Drug use: Yes    Types: Marijuana    Comment: 3-4 times per week  . Sexual  activity: None  Other Topics Concern  . None  Social History Narrative  . None   Review of Systems - See HPI.  All other ROS are negative.  BP 100/60   Pulse 65   Temp 97.9 F (36.6 C) (Oral)   Resp 14   Ht 5\' 4"  (1.626 m)   Wt 158 lb (71.7 kg)   SpO2 99%   BMI 27.12 kg/m   Physical Exam  Constitutional: She is oriented to person, place, and time and well-developed, well-nourished, and in no distress.  HENT:  Head: Normocephalic and atraumatic.  Eyes: Conjunctivae are normal.  Neck: Neck supple.  Cardiovascular: Normal rate, regular rhythm, normal heart sounds and intact distal pulses.  Neurological: She is alert and oriented to person, place, and time.  Skin: Skin is warm and dry. No rash noted.  Psychiatric: Affect normal.  Vitals reviewed.  Assessment/Plan: Bipolar disorder Doing very well. Will check CBC, TSH and Depakote level. If  stable will continue current regimen.     , PA-C

## 2017-11-08 NOTE — Assessment & Plan Note (Signed)
Doing very well. Will check CBC, TSH and Depakote level. If stable will continue current regimen.

## 2017-11-08 NOTE — Patient Instructions (Signed)
Please go to the lab today for blood work.  I will call you with your results. We will alter treatment regimen(s) if indicated by your results.   Please continue medications as directed. Follow-up 6 months for CPE.

## 2017-11-09 LAB — VALPROIC ACID LEVEL: VALPROIC ACID LVL: 47 mg/L — AB (ref 50.0–100.0)

## 2017-11-21 ENCOUNTER — Other Ambulatory Visit: Payer: Self-pay | Admitting: Emergency Medicine

## 2017-11-21 MED ORDER — DIVALPROEX SODIUM 250 MG PO DR TAB: DELAYED_RELEASE_TABLET | ORAL | 1 refills | Status: DC

## 2017-11-27 ENCOUNTER — Telehealth: Payer: Self-pay | Admitting: Physician Assistant

## 2017-11-27 MED ORDER — CITALOPRAM HYDROBROMIDE 20 MG PO TABS: 20.0000 mg | ORAL_TABLET | Freq: Every day | ORAL | 5 refills | Status: DC

## 2017-11-27 MED ORDER — ARIPIPRAZOLE 5 MG PO TABS: 5.0000 mg | ORAL_TABLET | Freq: Every day | ORAL | 5 refills | Status: DC

## 2017-11-27 MED ORDER — ACYCLOVIR 800 MG PO TABS: ORAL_TABLET | ORAL | 5 refills | Status: DC

## 2017-11-27 NOTE — Telephone Encounter (Signed)
Rx's sent to the patient preferred pharmacy

## 2017-11-27 NOTE — Telephone Encounter (Signed)
CRM for notification. See Telephone encounter for:   11/27/17.  Relation to MM:CRFV Call back number: [phone] Pharmacy: [id number] - [place], [place] - [address] AT [facility] [address] [phone] (Phone) [phone] (Fax)   Reason for call:   Patient last seen 11/08/17 and checking on the status of  6 month supply of ARIPiprazole (ABILIFY) 5 MG tablet, acyclovir (ZOVIRAX) 800 MG tablet, citalopram (CELEXA) 20 MG tablet, patient contacted pharmacy, please advise

## 2017-12-09 ENCOUNTER — Ambulatory Visit: Payer: Self-pay | Admitting: Family Medicine

## 2018-04-24 ENCOUNTER — Other Ambulatory Visit: Payer: Self-pay | Admitting: Emergency Medicine

## 2018-04-24 MED ORDER — ARIPIPRAZOLE 5 MG PO TABS: 5.0000 mg | ORAL_TABLET | Freq: Every day | ORAL | 0 refills | Status: DC

## 2018-05-16 ENCOUNTER — Other Ambulatory Visit: Payer: Self-pay | Admitting: Physician Assistant

## 2018-05-27 ENCOUNTER — Other Ambulatory Visit: Payer: Self-pay | Admitting: Physician Assistant

## 2018-06-12 ENCOUNTER — Other Ambulatory Visit: Payer: Self-pay | Admitting: Physician Assistant

## 2018-06-21 ENCOUNTER — Other Ambulatory Visit: Payer: Self-pay | Admitting: Physician Assistant

## 2018-06-26 ENCOUNTER — Other Ambulatory Visit: Payer: Self-pay | Admitting: Physician Assistant

## 2018-06-26 NOTE — Telephone Encounter (Signed)
Patient is due for a CPE. Need to schedule for more refills

## 2018-06-27 ENCOUNTER — Encounter: Payer: Self-pay | Admitting: Emergency Medicine

## 2018-07-04 ENCOUNTER — Ambulatory Visit: Payer: Commercial Managed Care - PPO | Admitting: Physician Assistant

## 2018-07-04 ENCOUNTER — Encounter: Payer: Self-pay | Admitting: Physician Assistant

## 2018-07-04 ENCOUNTER — Other Ambulatory Visit: Payer: Self-pay

## 2018-07-04 VITALS — BP 98/62 | HR 66 | Temp 98.1°F | Resp 14 | Ht 64.0 in | Wt 166.0 lb

## 2018-07-04 DIAGNOSIS — Z299 Encounter for prophylactic measures, unspecified: Secondary | ICD-10-CM

## 2018-07-04 DIAGNOSIS — M069 Rheumatoid arthritis, unspecified: Secondary | ICD-10-CM | POA: Diagnosis not present

## 2018-07-04 DIAGNOSIS — F3161 Bipolar disorder, current episode mixed, mild: Secondary | ICD-10-CM | POA: Diagnosis not present

## 2018-07-04 LAB — COMPREHENSIVE METABOLIC PANEL
ALT: 11 U/L (ref 0–35)
AST: 12 U/L (ref 0–37)
Albumin: 4.4 g/dL (ref 3.5–5.2)
Alkaline Phosphatase: 36 U/L — ABNORMAL LOW (ref 39–117)
BUN: 13 mg/dL (ref 6–23)
CHLORIDE: 104 meq/L (ref 96–112)
CO2: 32 meq/L (ref 19–32)
CREATININE: 0.73 mg/dL (ref 0.40–1.20)
Calcium: 9.6 mg/dL (ref 8.4–10.5)
GFR: 87.22 mL/min (ref >60.00)
GLUCOSE: 86 mg/dL (ref 70–99)
Potassium: 4.6 mEq/L (ref 3.5–5.1)
SODIUM: 141 meq/L (ref 135–145)
Total Bilirubin: 0.5 mg/dL (ref 0.2–1.2)
Total Protein: 7 g/dL (ref 6.0–8.3)

## 2018-07-04 LAB — CBC WITH DIFFERENTIAL/PLATELET
BASOS PCT: 0.5 % (ref 0.0–3.0)
Basophils Absolute: 0 10*3/uL (ref 0.0–0.1)
EOS ABS: 0.1 10*3/uL (ref 0.0–0.7)
Eosinophils Relative: 1.9 % (ref 0.0–5.0)
HCT: 37.6 % (ref 36.0–46.0)
Hemoglobin: 12.6 g/dL (ref 12.0–15.0)
LYMPHS ABS: 2.1 10*3/uL (ref 0.7–4.0)
Lymphocytes Relative: 32.8 % (ref 12.0–46.0)
MCHC: 33.4 g/dL (ref 30.0–36.0)
MCV: 90.7 fl (ref 78.0–100.0)
MONO ABS: 0.6 10*3/uL (ref 0.1–1.0)
Monocytes Relative: 9.4 % (ref 3.0–12.0)
NEUTROS PCT: 55.4 % (ref 43.0–77.0)
Neutro Abs: 3.5 10*3/uL (ref 1.4–7.7)
Platelets: 212 10*3/uL (ref 150.0–400.0)
RBC: 4.14 Mil/uL (ref 3.87–5.11)
RDW: 12.8 % (ref 11.5–15.5)
WBC: 6.2 10*3/uL (ref 4.0–10.5)

## 2018-07-04 LAB — LIPID PANEL
CHOL/HDL RATIO: 3
Cholesterol: 201 mg/dL — ABNORMAL HIGH (ref 0–200)
HDL: 70.2 mg/dL (ref >39.00)
LDL CALC: 106 mg/dL — AB (ref 0–99)
NONHDL: 130.5
Triglycerides: 122 mg/dL (ref 0.0–149.0)
VLDL: 24.4 mg/dL (ref 0.0–40.0)

## 2018-07-04 NOTE — Progress Notes (Signed)
Patient presents to clinic today for follow-up of Bipolar Disorder. Patient is currently on a regimen of Depakote, Abilify and Citalopram. Has noted doing extremely well on this regimen. Denies breakthrough mania. Denies depressed mood or anhedonia. Is doing well with sleep. Denies SI/HI.   Past Medical History:  Diagnosis Date  . Chicken pox   . Chronic rheumatic arthritis ([place])   . GERD (gastroesophageal reflux disease)   . Hemorrhoids   . Hyperlipidemia    Borderline  . Mumps   . RLS (restless legs syndrome)   . Urinary incontinence    Leakage  . UTI (lower urinary tract infection)     Current Outpatient Medications on File Prior to Visit  Medication Sig Dispense Refill  . acyclovir (ZOVIRAX) 800 MG tablet TAKE 1 TABLET(800 MG) BY MOUTH DAILY 30 tablet 0  . ALPRAZolam (XANAX) 0.25 MG tablet Take 1 tablet (0.25 mg total) by mouth 2 (two) times daily as needed for anxiety. 10 tablet 0  . ARIPiprazole (ABILIFY) 5 MG tablet Take 1 tablet (5 mg total) by mouth daily. 90 tablet 0  . Ascorbic Acid (VITAMIN C) 1000 MG tablet Take 1,000 mg by mouth daily.    . Bacillus Coagulans-Inulin (PROBIOTIC FORMULA) 1-250 BILLION-MG CAPS Take by mouth daily.    . citalopram (CELEXA) 20 MG tablet Take 1 tablet by mouth daily. Please schedule an physical for more refill 15 tablet 0  . Cranberry-Vitamin C 84-20 MG CAPS Take by mouth daily.    . divalproex (DEPAKOTE) 250 MG DR tablet TAKE 1 TABLET BY MOUTH THREE TIMES DAILY 30 tablet 0  . Multiple Vitamins-Minerals (CENTRUM ADULTS) TABS Take by mouth daily.     No current facility-administered medications on file prior to visit.     Allergies  Allergen Reactions  . Lithium Other (See Comments)    Boils  . Penicillins Hives    Family History  Problem Relation Age of Onset  . Mental illness Mother        Living  . Dementia Mother   . Parkinson's disease Father        Deceased  . Alzheimer's disease Father   . Cancer Maternal Grandmother     . Diabetes Maternal Uncle   . Arthritis Maternal Aunt        Psoriatic   . Alcohol abuse Brother   . Healthy Brother   . Healthy Sister   . Allergies Daughter   . Healthy Daughter   . Healthy Son        x2  . Colon cancer Neg Hx     Social History   Socioeconomic History  . Marital status: Married    Spouse name: Not on file  . Number of children: Not on file  . Years of education: Not on file  . Highest education level: Not on file  Occupational History  . Not on file  Social Needs  . Financial resource strain: Not on file  . Food insecurity:    Worry: Not on file    Inability: Not on file  . Transportation needs:    Medical: Not on file    Non-medical: Not on file  Tobacco Use  . Smoking status: Former Smoker    Last attempt to quit: 06/30/2015    Years since quitting: 3.0  . Smokeless tobacco: Never Used  Substance and Sexual Activity  . Alcohol use: Yes    Alcohol/week: 0.0 standard drinks    Comment: very rare  . Drug use:  Yes    Types: Marijuana    Comment: 3-4 times per week  . Sexual activity: Not on file  Lifestyle  . Physical activity:    Days per week: Not on file    Minutes per session: Not on file  . Stress: Not on file  Relationships  . Social connections:    Talks on phone: Not on file    Gets together: Not on file    Attends religious service: Not on file    Active member of club or organization: Not on file    Attends meetings of clubs or organizations: Not on file    Relationship status: Not on file  Other Topics Concern  . Not on file  Social History Narrative  . Not on file   Review of Systems - See HPI.  All other ROS are negative.  BP 98/62   Pulse 66   Temp 98.1 F (36.7 C) (Oral)   Resp 14   Ht 5' 4"  (1.626 m)   Wt 166 lb (75.3 kg)   SpO2 99%   BMI 28.49 kg/m   Physical Exam  Constitutional: She appears well-developed and well-nourished.  HENT:  Head: Normocephalic and atraumatic.  Eyes: Pupils are equal, round,  and reactive to light. Conjunctivae are normal.  Neck: Neck supple.  Cardiovascular: Normal rate, regular rhythm, normal heart sounds and intact distal pulses.  Pulmonary/Chest: Effort normal and breath sounds normal.  Psychiatric: She has a normal mood and affect.  Vitals reviewed.  Assessment/Plan: 1. Bipolar disorder, current episode mixed, mild ([place]) Doing very well. Will check routine labs as noted below. Continue current regimen.  - CBC w/Diff - Comp Met (CMET) - Valproic Acid level  2. Preventive measure Fasting lipids obtained today. - Lipid Profile  3. Rheumatoid arthritis involving multiple sites, unspecified rheumatoid factor presence ([place]) Followed by Rheumatology. No symptoms and no treatment. Question diagnosis.    Leeanne Rio, PA-C

## 2018-07-04 NOTE — Patient Instructions (Addendum)
Please go to the lab today for blood work.  I will call you with your results. We will alter treatment regimen(s) if indicated by your results.   Please continue the current medication regimen. I am glad you are doing so well.  Follow-up in 6 months.

## 2018-07-05 LAB — VALPROIC ACID LEVEL: VALPROIC ACID LVL: 60.7 mg/L (ref 50.0–100.0)

## 2018-07-06 ENCOUNTER — Other Ambulatory Visit: Payer: Self-pay | Admitting: Physician Assistant

## 2018-07-11 ENCOUNTER — Other Ambulatory Visit: Payer: Self-pay | Admitting: Physician Assistant

## 2018-08-01 ENCOUNTER — Other Ambulatory Visit: Payer: Self-pay | Admitting: Emergency Medicine

## 2018-08-01 MED ORDER — ACYCLOVIR 800 MG PO TABS: 800.0000 mg | ORAL_TABLET | Freq: Every day | ORAL | 3 refills | Status: DC

## 2018-08-07 ENCOUNTER — Other Ambulatory Visit: Payer: Self-pay | Admitting: Physician Assistant

## 2018-09-04 ENCOUNTER — Other Ambulatory Visit: Payer: Self-pay | Admitting: Physician Assistant

## 2018-09-04 DIAGNOSIS — Z1231 Encounter for screening mammogram for malignant neoplasm of breast: Secondary | ICD-10-CM

## 2018-09-11 ENCOUNTER — Other Ambulatory Visit: Payer: Self-pay | Admitting: Physician Assistant

## 2018-09-12 ENCOUNTER — Ambulatory Visit (HOSPITAL_BASED_OUTPATIENT_CLINIC_OR_DEPARTMENT_OTHER)
Admission: RE | Admit: 2018-09-12 | Discharge: 2018-09-12 | Disposition: A | Discharge status: Home / Self Care | Payer: Commercial Managed Care - PPO | Source: Ambulatory Visit | Attending: Physician Assistant | Admitting: Physician Assistant

## 2018-09-12 ENCOUNTER — Encounter (HOSPITAL_BASED_OUTPATIENT_CLINIC_OR_DEPARTMENT_OTHER): Payer: Self-pay

## 2018-09-12 DIAGNOSIS — Z1231 Encounter for screening mammogram for malignant neoplasm of breast: Secondary | ICD-10-CM | POA: Insufficient documentation

## 2018-09-15 ENCOUNTER — Encounter: Payer: Self-pay | Admitting: Emergency Medicine

## 2018-10-09 ENCOUNTER — Other Ambulatory Visit: Payer: Self-pay | Admitting: Emergency Medicine

## 2018-10-09 MED ORDER — DIVALPROEX SODIUM 250 MG PO DR TAB: 250.0000 mg | DELAYED_RELEASE_TABLET | Freq: Three times a day (TID) | ORAL | 1 refills | Status: DC

## 2018-11-21 ENCOUNTER — Other Ambulatory Visit: Payer: Self-pay | Admitting: Physician Assistant

## 2019-01-15 ENCOUNTER — Other Ambulatory Visit: Payer: Self-pay | Admitting: Physician Assistant

## 2019-01-30 ENCOUNTER — Other Ambulatory Visit: Payer: Self-pay | Admitting: Physician Assistant

## 2019-01-30 NOTE — Telephone Encounter (Signed)
Patient has not been seen since Septemer 209. Please advise

## 2019-01-30 NOTE — Telephone Encounter (Signed)
/  Patient has been scheduled for VV on 02/13/19

## 2019-01-30 NOTE — Telephone Encounter (Signed)
I have sent in 30-day supply with 0 refills. Please call to schedule her for a virtual visit follow-up. Thank you

## 2019-02-13 ENCOUNTER — Encounter: Payer: Self-pay | Admitting: Physician Assistant

## 2019-02-13 ENCOUNTER — Ambulatory Visit (INDEPENDENT_AMBULATORY_CARE_PROVIDER_SITE_OTHER): Payer: Commercial Managed Care - PPO | Admitting: Physician Assistant

## 2019-02-13 ENCOUNTER — Other Ambulatory Visit: Payer: Self-pay

## 2019-02-13 VITALS — Ht 64.0 in | Wt 163.0 lb

## 2019-02-13 DIAGNOSIS — F3161 Bipolar disorder, current episode mixed, mild: Secondary | ICD-10-CM

## 2019-02-13 NOTE — Patient Instructions (Signed)
Instructions sent to MyChart.   I am glad you are doing so well! Please continue medications as directed. Consider starting Pure ZZZs over the counter to help with more restful sleep. Also, please read the sleep hygiene practices below.  Do not forget to come by the lab next Friday morning (02/20/2019) at 9:00 for your labs. I will call with results and further directions at that time.   Sleep Hygiene  Do: (1) Go to bed at the same time each day. (2) Get up from bed at the same time each day. (3) Get regular exercise each day, preferably in the morning.  There is goof evidence that regular exercise improves restful sleep.  This includes stretching and aerobic exercise. (4) Get regular exposure to outdoor or bright lights, especially in the late afternoon. (5) Keep the temperature in your bedroom comfortable. (6) Keep the bedroom quiet when sleeping. (7) Keep the bedroom dark enough to facilitate sleep. (8) Use your bed only for sleep and sex. (9) Take medications as directed.  It is helpful to take prescribed sleeping pills 1 hour before bedtime, so they are causing drowsiness when you lie down, or 10 hours before getting up, to avoid daytime drowsiness. (10) Use a relaxation exercise just before going to sleep -- imagery, massage, warm bath. (11) Keep your feet and hands warm.  Wear warm socks and/or mittens or gloves to bed.  Don't: (1) Exercise just before going to bed. (2) Engage in stimulating activity just before bed, such as playing a competitive game, watching an exciting program on television, or having an important discussion with a loved one. (3) Have caffeine in the evening (coffee, teas, chocolate, sodas, etc.) (4) Read or watch television in bed. (5) Use alcohol to help you sleep. (6) Go to bed too hungry or too full. (7) Take another person's sleeping pills. (8) Take over-the-counter sleeping pills, without your doctor's knowledge.  Tolerance can develop rapidly with  these medications.  Diphenhydramine can have serious side effects for elderly patients. (9) Take daytime naps. (10) Command yourself to go to sleep.  This only makes your mind and body more alert.  If you lie awake for more than 20-30 minutes, get up, go to a different room, participate in a quiet activity (Ex - non-excitable reading or television), and then return to bed when you feel sleepy.  Do this as many times during the night as needed.  This may cause you to have a night or two of poor sleep but it will train your brain to know when it is time for sleep.

## 2019-02-13 NOTE — Progress Notes (Signed)
Virtual Visit via Video   I connected with patient on 02/13/19 at 10:00 AM EDT by a video enabled telemedicine application and verified that I am speaking with the correct person using two identifiers.  Location patient: Home Location provider: [facility], Office Persons participating in the virtual visit: Patient, Provider, [place] (Patina Moore)  I discussed the limitations of evaluation and management by telemedicine and the availability of in person appointments. The patient expressed understanding and agreed to proceed.  Subjective:   HPI:   Patient presents today via Doxy.Me for follow-up of Bipolar Disorder. Patient is on a long-standing regimen of Depakote 250 mg BID, Abilify 5 mg QD and Citalopram 57m QD. Endorses taking daily as directed and still tolerating well. Has been very well-controlled on this regimen for some time. Notes mood is still doing great overall. Some decreased sleep recently but still getting about 6-7 hours of sleep at night. Is due for repeat monitoring labs.   ROS:   See pertinent positives and negatives per HPI.  Patient Active Problem List   Diagnosis Date Noted  . Breast cancer screening 09/02/2015  . Colon cancer screening 09/02/2015  . Recurrent oral herpes simplex 07/25/2014  . Bipolar disorder ([place] 02/24/2014  . Visit for preventive health examination 02/24/2014  . Rheumatoid arthritis ([place] 02/24/2014    Social History   Tobacco Use  . Smoking status: Former Smoker    Last attempt to quit: 06/30/2015    Years since quitting: 3.6  . Smokeless tobacco: Never Used  Substance Use Topics  . Alcohol use: Yes    Alcohol/week: 0.0 standard drinks    Comment: very rare    Current Outpatient Medications:  .  acyclovir (ZOVIRAX) 800 MG tablet, TAKE 1 TABLET(800 MG) BY MOUTH DAILY, Disp: 30 tablet, Rfl: 3 .  ARIPiprazole (ABILIFY) 5 MG tablet, TAKE 1 TABLET(5 MG) BY MOUTH DAILY, Disp: 90 tablet, Rfl: 0 .  Ascorbic Acid (VITAMIN C)  1000 MG tablet, Take 1,000 mg by mouth daily., Disp: , Rfl:  .  Bacillus Coagulans-Inulin (PROBIOTIC FORMULA) 1-250 BILLION-MG CAPS, Take by mouth daily., Disp: , Rfl:  .  citalopram (CELEXA) 20 MG tablet, TAKE 1 TABLET BY MOUTH EVERY DAY, Disp: 30 tablet, Rfl: 0 .  Cranberry-Vitamin C 84-20 MG CAPS, Take by mouth daily., Disp: , Rfl:  .  divalproex (DEPAKOTE) 250 MG DR tablet, Take 1 tablet (250 mg total) by mouth 3 (three) times daily., Disp: 270 tablet, Rfl: 1 .  Multiple Vitamins-Minerals (CENTRUM ADULTS) TABS, Take by mouth daily., Disp: , Rfl:   Allergies  Allergen Reactions  . Lithium Other (See Comments)    Boils  . Penicillins Hives    Objective:   Ht 5' 4"  (1.626 m)   Wt 163 lb (73.9 kg)   BMI 27.98 kg/m   Patient is well-developed, well-nourished in no acute distress.  Resting comfortably at home.  Head is normocephalic, atraumatic.  No labored breathing.  Speech is clear and coherent with logical contest.  Patient is alert and oriented at baseline.   Assessment and Plan:        Bipolar disorder Stable. Medications refilled. Continue current regimen. Lab appointment scheduled for monitoring labs.   Lab Orders     CBC w/Diff     Comp Met (CMET)     Valproic Acid level     TSH     B12       WLeeanne Rio PA-C 02/13/2019  Time spent with the patient: 15  minutes, of which >50% was spent in obtaining information about symptoms, reviewing previous labs, evaluations, and treatments, counseling about condition (please see the discussed topics above), and developing a plan to further investigate it; had a number of questions which I addressed.

## 2019-02-13 NOTE — Progress Notes (Signed)
I have discussed the procedure for the virtual visit with the patient who has given consent to proceed with assessment and treatment.   Hannan Tetzlaff S Namira Rosekrans, CMA     

## 2019-02-13 NOTE — Assessment & Plan Note (Signed)
Stable. Medications refilled. Continue current regimen. Lab appointment scheduled for monitoring labs.   Lab Orders     CBC w/Diff     Comp Met (CMET)     Valproic Acid level     TSH     B12

## 2019-02-20 ENCOUNTER — Other Ambulatory Visit (INDEPENDENT_AMBULATORY_CARE_PROVIDER_SITE_OTHER): Payer: Commercial Managed Care - PPO

## 2019-02-20 DIAGNOSIS — M069 Rheumatoid arthritis, unspecified: Secondary | ICD-10-CM | POA: Diagnosis not present

## 2019-02-20 DIAGNOSIS — F3161 Bipolar disorder, current episode mixed, mild: Secondary | ICD-10-CM | POA: Diagnosis not present

## 2019-02-20 LAB — COMPREHENSIVE METABOLIC PANEL
ALT: 14 U/L (ref 0–35)
AST: 12 U/L (ref 0–37)
Albumin: 4.3 g/dL (ref 3.5–5.2)
Alkaline Phosphatase: 39 U/L (ref 39–117)
BUN: 19 mg/dL (ref 6–23)
CO2: 32 mEq/L (ref 19–32)
Calcium: 9.5 mg/dL (ref 8.4–10.5)
Chloride: 102 mEq/L (ref 96–112)
Creatinine, Ser: 0.78 mg/dL (ref 0.40–1.20)
GFR: 75.86 mL/min (ref >60.00)
Glucose, Bld: 81 mg/dL (ref 70–99)
Potassium: 4.7 mEq/L (ref 3.5–5.1)
Sodium: 141 mEq/L (ref 135–145)
Total Bilirubin: 0.2 mg/dL (ref 0.2–1.2)
Total Protein: 6.8 g/dL (ref 6.0–8.3)

## 2019-02-20 LAB — CBC WITH DIFFERENTIAL/PLATELET
Basophils Absolute: 0 10*3/uL (ref 0.0–0.1)
Basophils Relative: 0.6 % (ref 0.0–3.0)
Eosinophils Absolute: 0.1 10*3/uL (ref 0.0–0.7)
Eosinophils Relative: 2.1 % (ref 0.0–5.0)
HCT: 38.8 % (ref 36.0–46.0)
Hemoglobin: 12.9 g/dL (ref 12.0–15.0)
Lymphocytes Relative: 35.4 % (ref 12.0–46.0)
Lymphs Abs: 1.8 10*3/uL (ref 0.7–4.0)
MCHC: 33.3 g/dL (ref 30.0–36.0)
MCV: 91.1 fl (ref 78.0–100.0)
Monocytes Absolute: 0.5 10*3/uL (ref 0.1–1.0)
Monocytes Relative: 10.1 % (ref 3.0–12.0)
Neutro Abs: 2.6 10*3/uL (ref 1.4–7.7)
Neutrophils Relative %: 51.8 % (ref 43.0–77.0)
Platelets: 212 10*3/uL (ref 150.0–400.0)
RBC: 4.26 Mil/uL (ref 3.87–5.11)
RDW: 12.7 % (ref 11.5–15.5)
WBC: 5.1 10*3/uL (ref 4.0–10.5)

## 2019-02-20 LAB — TSH: TSH: 2.83 u[IU]/mL (ref 0.35–4.50)

## 2019-02-20 LAB — HIGH SENSITIVITY CRP: CRP, High Sensitivity: 0.89 mg/L (ref 0.000–5.000)

## 2019-02-20 LAB — VITAMIN B12: Vitamin B-12: 581 pg/mL (ref 211–911)

## 2019-02-21 LAB — VALPROIC ACID LEVEL: Valproic Acid Lvl: 63.7 mg/L (ref 50.0–100.0)

## 2019-02-26 ENCOUNTER — Other Ambulatory Visit: Payer: Self-pay | Admitting: Physician Assistant

## 2019-03-28 ENCOUNTER — Other Ambulatory Visit: Payer: Self-pay | Admitting: Physician Assistant

## 2019-04-04 ENCOUNTER — Other Ambulatory Visit: Payer: Self-pay | Admitting: Physician Assistant

## 2019-04-09 ENCOUNTER — Other Ambulatory Visit: Payer: Self-pay | Admitting: Physician Assistant

## 2019-07-09 ENCOUNTER — Other Ambulatory Visit: Payer: Self-pay | Admitting: Physician Assistant

## 2019-07-23 ENCOUNTER — Other Ambulatory Visit: Payer: Self-pay | Admitting: Physician Assistant

## 2019-07-23 MED ORDER — ACYCLOVIR 800 MG PO TABS: ORAL_TABLET | ORAL | 3 refills | Status: DC

## 2019-10-03 ENCOUNTER — Other Ambulatory Visit: Payer: Self-pay | Admitting: Physician Assistant

## 2019-10-08 ENCOUNTER — Other Ambulatory Visit: Payer: Self-pay | Admitting: Physician Assistant

## 2019-10-08 ENCOUNTER — Encounter: Payer: Self-pay | Admitting: Emergency Medicine

## 2019-10-16 ENCOUNTER — Ambulatory Visit (INDEPENDENT_AMBULATORY_CARE_PROVIDER_SITE_OTHER): Payer: Commercial Managed Care - PPO | Admitting: Physician Assistant

## 2019-10-16 ENCOUNTER — Encounter: Payer: Self-pay | Admitting: Physician Assistant

## 2019-10-16 ENCOUNTER — Other Ambulatory Visit: Payer: Self-pay

## 2019-10-16 DIAGNOSIS — M069 Rheumatoid arthritis, unspecified: Secondary | ICD-10-CM | POA: Diagnosis not present

## 2019-10-16 DIAGNOSIS — B002 Herpesviral gingivostomatitis and pharyngotonsillitis: Secondary | ICD-10-CM | POA: Diagnosis not present

## 2019-10-16 DIAGNOSIS — F3161 Bipolar disorder, current episode mixed, mild: Secondary | ICD-10-CM | POA: Diagnosis not present

## 2019-10-16 MED ORDER — CITALOPRAM HYDROBROMIDE 20 MG PO TABS: 20.0000 mg | ORAL_TABLET | Freq: Every day | ORAL | 1 refills | Status: DC

## 2019-10-16 MED ORDER — ARIPIPRAZOLE 5 MG PO TABS: 5.0000 mg | ORAL_TABLET | Freq: Every day | ORAL | 1 refills | Status: DC

## 2019-10-16 MED ORDER — ACYCLOVIR 800 MG PO TABS: ORAL_TABLET | ORAL | 1 refills | Status: DC

## 2019-10-16 MED ORDER — DIVALPROEX SODIUM 250 MG PO DR TAB: 250.0000 mg | DELAYED_RELEASE_TABLET | Freq: Three times a day (TID) | ORAL | 1 refills | Status: DC

## 2019-10-16 NOTE — Progress Notes (Signed)
I have discussed the procedure for the virtual visit with the patient who has given consent to proceed with assessment and treatment.   Laverta Harnisch S Webber Michiels, CMA     

## 2019-10-16 NOTE — Progress Notes (Signed)
Virtual Visit via Video   I connected with patient on 10/16/19 at 11:30 AM EST by a video enabled telemedicine application and verified that I am speaking with the correct person using two identifiers.  Location patient: Home Location provider: [facility], Office Persons participating in the virtual visit: Patient, Provider, [place] (Patina Moore)  I discussed the limitations of evaluation and management by telemedicine and the availability of in person appointments. The patient expressed understanding and agreed to proceed.  Subjective:   HPI:   Patient presents via doxy.need today to follow-up for chronic medical issues.  Patient with history of oral herpes, bipolar disorder.  Patient is currently on a regimen of acyclovir 800 mg once daily for her recurrent herpes.  Still having a couple of outbreaks per year, but much improved with daily preventative.  States outbreaks happen when she is stressed.  Notes last outbreak a few weeks ago.  Has resolved.  Is in need of medication refill.  In regards to bipolar disorder, patient is currently on a regimen of Depakote, citalopram and Abilify.  Endorses taking all medications as directed and still tolerating well.  Notes this regimen has worked very well for her.  Still feels that overall her mood is under very good control.  Denies recent manic episode.  Denies thoughts of harming herself or others.  Is due for Depakote level and other monitoring labs.  Patient also with prior history of rheumatoid arthritis.  Asked to see about being rechecked for this as she has no issue with joint pain, swelling or stiffness.  Previously seen by rheumatology.  ROS:   See pertinent positives and negatives per HPI.  Patient Active Problem List   Diagnosis Date Noted  . Breast cancer screening 09/02/2015  . Colon cancer screening 09/02/2015  . Recurrent oral herpes simplex 07/25/2014  . Bipolar disorder ([place]) 02/24/2014  . Visit for preventive  health examination 02/24/2014  . Rheumatoid arthritis ([place]) 02/24/2014    Social History   Tobacco Use  . Smoking status: Former Smoker    Quit date: 06/30/2015    Years since quitting: 4.2  . Smokeless tobacco: Never Used  Substance Use Topics  . Alcohol use: Yes    Alcohol/week: 0.0 standard drinks    Comment: very rare    Current Outpatient Medications:  .  acyclovir (ZOVIRAX) 800 MG tablet, TAKE 1 TABLET(800 MG) BY MOUTH DAILY, Disp: 30 tablet, Rfl: 3 .  ARIPiprazole (ABILIFY) 5 MG tablet, Take 1 tablet (5 mg total) by mouth daily. Please schedule your physical for more refills, Disp: 30 tablet, Rfl: 0 .  Ascorbic Acid (VITAMIN C) 1000 MG tablet, Take 1,000 mg by mouth daily., Disp: , Rfl:  .  Bacillus Coagulans-Inulin (PROBIOTIC FORMULA) 1-250 BILLION-MG CAPS, Take by mouth daily., Disp: , Rfl:  .  citalopram (CELEXA) 20 MG tablet, TAKE 1 TABLET BY MOUTH EVERY DAY, Disp: 90 tablet, Rfl: 0 .  Cranberry-Vitamin C 84-20 MG CAPS, Take by mouth daily., Disp: , Rfl:  .  divalproex (DEPAKOTE) 250 MG DR tablet, Take 1 tablet (250 mg total) by mouth 3 (three) times daily., Disp: 270 tablet, Rfl: 1 .  Multiple Vitamins-Minerals (CENTRUM ADULTS) TABS, Take by mouth daily., Disp: , Rfl:   Allergies  Allergen Reactions  . Lithium Other (See Comments)    Boils  . Penicillins Hives    Objective:   There were no vitals taken for this visit.  Patient is well-developed, well-nourished in no acute distress.  Resting comfortably  at home.  Head is normocephalic, atraumatic.  No labored breathing.  Speech is clear and coherent with logical content.  Patient is alert and oriented at baseline.   Assessment and Plan:    1. Recurrent oral herpes simplex Stable.  Continue current regimen.  Stress relief tactics discussed. - acyclovir (ZOVIRAX) 800 MG tablet; TAKE 1 TABLET(800 MG) BY MOUTH DAILY  Dispense: 90 tablet; Refill: 1  2. Bipolar disorder, current episode mixed, mild ([place]) Doing  very well overall.  We will continue current regimen.  Patient scheduled for repeat labs as noted below. - ARIPiprazole (ABILIFY) 5 MG tablet; Take 1 tablet (5 mg total) by mouth daily. Please schedule your physical for more refills  Dispense: 90 tablet; Refill: 1 - citalopram (CELEXA) 20 MG tablet; Take 1 tablet (20 mg total) by mouth daily.  Dispense: 90 tablet; Refill: 1 - divalproex (DEPAKOTE) 250 MG DR tablet; Take 1 tablet (250 mg total) by mouth 3 (three) times daily.  Dispense: 270 tablet; Refill: 1 - Comp Met (CMET); Future - Lipid Profile; Future  3. Rheumatoid arthritis involving multiple sites, unspecified whether rheumatoid factor present ([place]) Very odd that she has had abnormal testing before but has no ongoing issues with joint stiffness, pain, redness or swelling, especially not on any DMARD.  Was on Humira in the past.  Will recheck rheumatoid factor and anti-CCP. - Rheumatoid Factor; Future - Cyclic citrul peptide antibody, IgG ([facility]); Future .   Leeanne Rio, PA-C 10/16/2019

## 2019-11-06 ENCOUNTER — Other Ambulatory Visit: Payer: Self-pay

## 2019-11-06 ENCOUNTER — Ambulatory Visit (INDEPENDENT_AMBULATORY_CARE_PROVIDER_SITE_OTHER): Payer: Commercial Managed Care - PPO

## 2019-11-06 DIAGNOSIS — M069 Rheumatoid arthritis, unspecified: Secondary | ICD-10-CM

## 2019-11-06 DIAGNOSIS — F3161 Bipolar disorder, current episode mixed, mild: Secondary | ICD-10-CM

## 2019-11-06 NOTE — Addendum Note (Signed)
Addended by: Laddie Aquas A on: 11/06/2019 08:49 AM   Modules accepted: Orders

## 2019-11-09 LAB — COMPREHENSIVE METABOLIC PANEL
AG Ratio: 1.6 (calc) (ref 1.0–2.5)
ALT: 19 U/L (ref 6–29)
AST: 18 U/L (ref 10–35)
Albumin: 4.2 g/dL (ref 3.6–5.1)
Alkaline phosphatase (APISO): 39 U/L (ref 37–153)
BUN: 16 mg/dL (ref 7–25)
CO2: 28 mmol/L (ref 20–32)
Calcium: 9.8 mg/dL (ref 8.6–10.4)
Chloride: 103 mmol/L (ref 98–110)
Creat: 0.82 mg/dL (ref 0.50–1.05)
Globulin: 2.6 g/dL (calc) (ref 1.9–3.7)
Glucose, Bld: 75 mg/dL (ref 65–99)
Potassium: 4.4 mmol/L (ref 3.5–5.3)
Sodium: 141 mmol/L (ref 135–146)
Total Bilirubin: 0.4 mg/dL (ref 0.2–1.2)
Total Protein: 6.8 g/dL (ref 6.1–8.1)

## 2019-11-09 LAB — LIPID PANEL
Cholesterol: 207 mg/dL — ABNORMAL HIGH (ref <200)
HDL: 67 mg/dL (ref >=50)
LDL Cholesterol (Calc): 120 mg/dL (calc) — ABNORMAL HIGH
Non-HDL Cholesterol (Calc): 140 mg/dL (calc) — ABNORMAL HIGH (ref <130)
Total CHOL/HDL Ratio: 3.1 (calc) (ref <5.0)
Triglycerides: 98 mg/dL (ref <150)

## 2019-11-09 LAB — VALPROIC ACID LEVEL: Valproic Acid Lvl: 40.1 mg/L — ABNORMAL LOW (ref 50.0–100.0)

## 2019-11-09 LAB — EXTRA LAV TOP TUBE

## 2019-11-09 LAB — RHEUMATOID FACTOR: Rheumatoid fact SerPl-aCnc: 30 IU/mL — ABNORMAL HIGH (ref <14)

## 2019-11-09 LAB — CYCLIC CITRUL PEPTIDE ANTIBODY, IGG: Cyclic Citrullin Peptide Ab: <16 UNITS

## 2019-11-15 ENCOUNTER — Encounter: Payer: Self-pay | Admitting: Physician Assistant

## 2019-11-16 ENCOUNTER — Other Ambulatory Visit: Payer: Self-pay

## 2019-11-16 ENCOUNTER — Other Ambulatory Visit: Payer: Self-pay | Admitting: *Deleted

## 2019-11-16 DIAGNOSIS — Z1231 Encounter for screening mammogram for malignant neoplasm of breast: Secondary | ICD-10-CM

## 2019-11-16 DIAGNOSIS — Z1239 Encounter for other screening for malignant neoplasm of breast: Secondary | ICD-10-CM

## 2019-11-16 DIAGNOSIS — Z299 Encounter for prophylactic measures, unspecified: Secondary | ICD-10-CM

## 2019-11-16 NOTE — Telephone Encounter (Signed)
Ok to place order for screening mammogram. Patient can then call them to schedule.

## 2019-11-16 NOTE — Telephone Encounter (Signed)
LVM to return call  Last mammogram done at [facility] phone #[phone] on 09/12/2018

## 2019-12-07 ENCOUNTER — Encounter: Payer: Self-pay | Admitting: Physician Assistant

## 2019-12-07 DIAGNOSIS — B002 Herpesviral gingivostomatitis and pharyngotonsillitis: Secondary | ICD-10-CM

## 2019-12-07 MED ORDER — ACYCLOVIR 800 MG PO TABS: ORAL_TABLET | ORAL | 1 refills | Status: DC

## 2020-01-15 ENCOUNTER — Ambulatory Visit: Payer: Commercial Managed Care - PPO

## 2020-03-18 ENCOUNTER — Other Ambulatory Visit: Payer: Self-pay

## 2020-03-18 ENCOUNTER — Ambulatory Visit
Admission: RE | Admit: 2020-03-18 | Discharge: 2020-03-18 | Disposition: A | Discharge status: Home / Self Care | Payer: Commercial Managed Care - PPO | Source: Ambulatory Visit | Attending: Physician Assistant | Admitting: Physician Assistant

## 2020-03-18 DIAGNOSIS — Z299 Encounter for prophylactic measures, unspecified: Secondary | ICD-10-CM

## 2020-05-06 ENCOUNTER — Other Ambulatory Visit: Payer: Self-pay | Admitting: Emergency Medicine

## 2020-05-06 ENCOUNTER — Encounter: Payer: Self-pay | Admitting: Emergency Medicine

## 2020-05-06 DIAGNOSIS — F3161 Bipolar disorder, current episode mixed, mild: Secondary | ICD-10-CM

## 2020-05-06 MED ORDER — ARIPIPRAZOLE 5 MG PO TABS: 5.0000 mg | ORAL_TABLET | Freq: Every day | ORAL | 0 refills | Status: DC

## 2020-05-16 ENCOUNTER — Other Ambulatory Visit: Payer: Self-pay

## 2020-05-16 ENCOUNTER — Ambulatory Visit (INDEPENDENT_AMBULATORY_CARE_PROVIDER_SITE_OTHER): Payer: Commercial Managed Care - PPO | Admitting: Physician Assistant

## 2020-05-16 ENCOUNTER — Encounter: Payer: Self-pay | Admitting: Physician Assistant

## 2020-05-16 VITALS — BP 100/60 | HR 70 | Temp 98.5°F | Resp 16 | Ht 64.0 in | Wt 168.0 lb

## 2020-05-16 DIAGNOSIS — F3161 Bipolar disorder, current episode mixed, mild: Secondary | ICD-10-CM | POA: Diagnosis not present

## 2020-05-16 DIAGNOSIS — Z111 Encounter for screening for respiratory tuberculosis: Secondary | ICD-10-CM

## 2020-05-16 DIAGNOSIS — L301 Dyshidrosis [pompholyx]: Secondary | ICD-10-CM | POA: Diagnosis not present

## 2020-05-16 DIAGNOSIS — Z0000 Encounter for general adult medical examination without abnormal findings: Secondary | ICD-10-CM | POA: Diagnosis not present

## 2020-05-16 LAB — COMPREHENSIVE METABOLIC PANEL
ALT: 17 U/L (ref 0–35)
AST: 12 U/L (ref 0–37)
Albumin: 4.3 g/dL (ref 3.5–5.2)
Alkaline Phosphatase: 42 U/L (ref 39–117)
BUN: 18 mg/dL (ref 6–23)
CO2: 32 mEq/L (ref 19–32)
Calcium: 9.6 mg/dL (ref 8.4–10.5)
Chloride: 100 mEq/L (ref 96–112)
Creatinine, Ser: 0.82 mg/dL (ref 0.40–1.20)
GFR: 71.3 mL/min (ref >60.00)
Glucose, Bld: 95 mg/dL (ref 70–99)
Potassium: 4.4 mEq/L (ref 3.5–5.1)
Sodium: 139 mEq/L (ref 135–145)
Total Bilirubin: 0.3 mg/dL (ref 0.2–1.2)
Total Protein: 7 g/dL (ref 6.0–8.3)

## 2020-05-16 LAB — LIPID PANEL
Cholesterol: 217 mg/dL — ABNORMAL HIGH (ref 0–200)
HDL: 59.5 mg/dL (ref >39.00)
LDL Cholesterol: 119 mg/dL — ABNORMAL HIGH (ref 0–99)
NonHDL: 157.55
Total CHOL/HDL Ratio: 4
Triglycerides: 192 mg/dL — ABNORMAL HIGH (ref 0.0–149.0)
VLDL: 38.4 mg/dL (ref 0.0–40.0)

## 2020-05-16 LAB — CBC WITH DIFFERENTIAL/PLATELET
Basophils Absolute: 0 10*3/uL (ref 0.0–0.1)
Basophils Relative: 0.5 % (ref 0.0–3.0)
Eosinophils Absolute: 0.2 10*3/uL (ref 0.0–0.7)
Eosinophils Relative: 2.5 % (ref 0.0–5.0)
HCT: 37.6 % (ref 36.0–46.0)
Hemoglobin: 12.9 g/dL (ref 12.0–15.0)
Lymphocytes Relative: 32.3 % (ref 12.0–46.0)
Lymphs Abs: 2.1 10*3/uL (ref 0.7–4.0)
MCHC: 34.2 g/dL (ref 30.0–36.0)
MCV: 91.5 fl (ref 78.0–100.0)
Monocytes Absolute: 0.6 10*3/uL (ref 0.1–1.0)
Monocytes Relative: 9.7 % (ref 3.0–12.0)
Neutro Abs: 3.5 10*3/uL (ref 1.4–7.7)
Neutrophils Relative %: 55 % (ref 43.0–77.0)
Platelets: 210 10*3/uL (ref 150.0–400.0)
RBC: 4.11 Mil/uL (ref 3.87–5.11)
RDW: 12.7 % (ref 11.5–15.5)
WBC: 6.4 10*3/uL (ref 4.0–10.5)

## 2020-05-16 LAB — TSH: TSH: 3.26 u[IU]/mL (ref 0.35–4.50)

## 2020-05-16 LAB — HEMOGLOBIN A1C: Hgb A1c MFr Bld: 5.3 % (ref 4.6–6.5)

## 2020-05-16 NOTE — Progress Notes (Signed)
Patient presents to clinic today for annual exam.  Patient is fasting for labs.  Acute Concerns: Patient needing a TB test for work today.   Patient also notes history of eczema of her hands that is blistery and becomes dry and flaking. Associated with itching. Has not been taking anything for her symptoms.  Chronic Issues: Bipolar Disorder -- Patient currently on a combination of Depakote, Citalopram and Abilify. Has done very well on this regimen thus far. Notes mood is stable. Resting well.  Health Maintenance: Immunizations -- UTD Colonoscopy -- UTD Mammogram -- UTD  Past Medical History:  Diagnosis Date  . Chicken pox   . Chronic rheumatic arthritis (HCC)   . GERD (gastroesophageal reflux disease)   . Hemorrhoids   . Hyperlipidemia    Borderline  . Mumps   . RLS (restless legs syndrome)   . Urinary incontinence    Leakage  . UTI (lower urinary tract infection)     Past Surgical History:  Procedure Laterality Date  . ABDOMINAL HYSTERECTOMY  2013  . AUGMENTATION MAMMAPLASTY    . BREAST ENHANCEMENT SURGERY  2005/1992  . FOOT SURGERY     Left, [place]  . TONSILLECTOMY AND ADENOIDECTOMY  1972    Current Outpatient Medications on File Prior to Visit  Medication Sig Dispense Refill  . acyclovir (ZOVIRAX) 800 MG tablet TAKE 1 TABLET(800 MG) BY MOUTH DAILY 90 tablet 1  . ARIPiprazole (ABILIFY) 5 MG tablet Take 1 tablet (5 mg total) by mouth daily. Please schedule your physical for more refills 30 tablet 0  . Ascorbic Acid (VITAMIN C) 1000 MG tablet Take 1,000 mg by mouth daily.    . Bacillus Coagulans-Inulin (PROBIOTIC FORMULA) 1-250 BILLION-MG CAPS Take by mouth daily.    . citalopram (CELEXA) 20 MG tablet Take 1 tablet (20 mg total) by mouth daily. 90 tablet 1  . Cranberry-Vitamin C 84-20 MG CAPS Take by mouth daily.    . divalproex (DEPAKOTE) 250 MG DR tablet Take 1 tablet (250 mg total) by mouth 3 (three) times daily. 270 tablet 1  . Multiple Vitamins-Minerals  (CENTRUM ADULTS) TABS Take by mouth daily.     No current facility-administered medications on file prior to visit.    Allergies  Allergen Reactions  . Lithium Other (See Comments)    Boils  . Penicillins Hives    Family History  Problem Relation Age of Onset  . Mental illness Mother        Living  . Dementia Mother   . Parkinson's disease Father        Deceased  . Alzheimer's disease Father   . Cancer Maternal Grandmother   . Diabetes Maternal Uncle   . Arthritis Maternal Aunt        Psoriatic   . Alcohol abuse Brother   . Healthy Brother   . Healthy Sister   . Allergies Daughter   . Healthy Daughter   . Healthy Son        x2  . Colon cancer Neg Hx     Social History   Socioeconomic History  . Marital status: Married    Spouse name: Leonette Most  . Number of children: Not on file  . Years of education: Not on file  . Highest education level: Not on file  Occupational History  . Not on file  Tobacco Use  . Smoking status: Former Smoker    Quit date: 06/30/2015    Years since quitting: 4.8  . Smokeless tobacco: Never  Used  Vaping Use  . Vaping Use: Never used  Substance and Sexual Activity  . Alcohol use: Yes    Alcohol/week: 0.0 standard drinks    Comment: very rare  . Drug use: Yes    Types: Marijuana    Comment: 3-4 times per week  . Sexual activity: Yes    Birth control/protection: Surgical  Other Topics Concern  . Not on file  Social History Narrative  . Not on file   Social Determinants of Health   Financial Resource Strain:   . Difficulty of Paying Living Expenses:   Food Insecurity:   . Worried About Programme researcher, broadcasting/film/video in the Last Year:   . Barista in the Last Year:   Transportation Needs:   . Freight forwarder (Medical):   Marland Kitchen Lack of Transportation (Non-Medical):   Physical Activity:   . Days of Exercise per Week:   . Minutes of Exercise per Session:   Stress:   . Feeling of Stress :   Social Connections:   . Frequency of  Communication with Friends and Family:   . Frequency of Social Gatherings with Friends and Family:   . Attends Religious Services:   . Active Member of Clubs or Organizations:   . Attends Banker Meetings:   Marland Kitchen Marital Status:   Intimate Partner Violence:   . Fear of Current or Ex-Partner:   . Emotionally Abused:   Marland Kitchen Physically Abused:   . Sexually Abused:    Review of Systems  Constitutional: Negative for fever and weight loss.  HENT: Negative for ear discharge, ear pain, hearing loss and tinnitus.   Eyes: Negative for blurred vision, double vision, photophobia and pain.  Respiratory: Negative for cough and shortness of breath.   Cardiovascular: Negative for chest pain and palpitations.  Gastrointestinal: Negative for abdominal pain, blood in stool, constipation, diarrhea, heartburn, melena, nausea and vomiting.  Genitourinary: Negative for dysuria, flank pain, frequency, hematuria and urgency.  Musculoskeletal: Negative for falls.  Neurological: Negative for dizziness, loss of consciousness and headaches.  Endo/Heme/Allergies: Negative for environmental allergies.  Psychiatric/Behavioral: Negative for depression, hallucinations, substance abuse and suicidal ideas. The patient is not nervous/anxious and does not have insomnia.    BP 100/60   Pulse 70   Temp 98.5 F (36.9 C) (Temporal)   Resp 16   Ht 5\' 4"  (1.626 m)   Wt 168 lb (76.2 kg)   SpO2 99%   BMI 28.84 kg/m   Physical Exam Vitals reviewed.  HENT:     Head: Normocephalic and atraumatic.     Right Ear: Tympanic membrane, ear canal and external ear normal.     Left Ear: Tympanic membrane, ear canal and external ear normal.     Nose: Nose normal. No mucosal edema.     Mouth/Throat:     Pharynx: Uvula midline. No oropharyngeal exudate or posterior oropharyngeal erythema.  Eyes:     Conjunctiva/sclera: Conjunctivae normal.     Pupils: Pupils are equal, round, and reactive to light.  Neck:     Thyroid: No  thyromegaly.  Cardiovascular:     Rate and Rhythm: Normal rate and regular rhythm.     Heart sounds: Normal heart sounds.  Pulmonary:     Effort: Pulmonary effort is normal. No respiratory distress.     Breath sounds: Normal breath sounds. No wheezing or rales.  Abdominal:     General: Bowel sounds are normal. There is no distension.  Palpations: Abdomen is soft. There is no mass.     Tenderness: There is no abdominal tenderness. There is no guarding or rebound.  Musculoskeletal:     Cervical back: Neck supple.  Lymphadenopathy:     Cervical: No cervical adenopathy.  Skin:    General: Skin is warm and dry.     Findings: No rash.       Neurological:     Mental Status: She is alert and oriented to person, place, and time.     Cranial Nerves: No cranial nerve deficit.  Psychiatric:        Mood and Affect: Mood normal.    Assessment/Plan: 1. Visit for preventive health examination Depression screen negative. Health Maintenance reviewed. Preventive schedule discussed and handout given in AVS. Will obtain fasting labs today.  - Comprehensive metabolic panel - CBC with Differential/Platelet - Lipid panel - Hemoglobin A1c - TSH  2. Bipolar disorder, current episode mixed, mild (HCC) Stable. Continue current regimen. Repeat monitoring labs today. - CBC with Differential/Platelet - TSH - Valproic Acid level  3. Screening-pulmonary TB TB Gold test added today as she needs screen for work. Asymptomatic. Low risk. - QuantiFERON-TB Gold Plus  4. Dyshidrotic eczema Rx Kenalog BID x 2 weeks then PRN. Supportive measures reviewed. Follow-up if not under control with current regimen.   This visit occurred during the SARS-CoV-2 public health emergency.  Safety protocols were in place, including screening questions prior to the visit, additional usage of staff PPE, and extensive cleaning of exam room while observing appropriate contact time as indicated for disinfecting solutions.      Piedad Climes, PA-C

## 2020-05-16 NOTE — Patient Instructions (Addendum)
Please go to the lab for blood work.   -Our office will call you with your results unless you have chosen to receive results via MyChart. -If your blood work is normal we will follow-up each year for physicals and as scheduled for chronic medical problems. -If anything is abnormal we will treat accordingly and get you in for a follow-up.  Start the triamcinolone twice daily for 2 weeks. Then as needed. Wear rubber gloves when doing dishes. Let me know if things are not improving/resolving.    Preventive Care 53-18 Years Old, Female Preventive care refers to visits with your health care provider and lifestyle choices that can promote health and wellness. This includes:  A yearly physical exam. This may also be called an annual well check.  Regular dental visits and eye exams.  Immunizations.  Screening for certain conditions.  Healthy lifestyle choices, such as eating a healthy diet, getting regular exercise, not using drugs or products that contain nicotine and tobacco, and limiting alcohol use. What can I expect for my preventive care visit? Physical exam Your health care provider will check your:  Height and weight. This may be used to calculate body mass index (BMI), which tells if you are at a healthy weight.  Heart rate and blood pressure.  Skin for abnormal spots. Counseling Your health care provider may ask you questions about your:  Alcohol, tobacco, and drug use.  Emotional well-being.  Home and relationship well-being.  Sexual activity.  Eating habits.  Work and work Statistician.  Method of birth control.  Menstrual cycle.  Pregnancy history. What immunizations do I need?  Influenza (flu) vaccine  This is recommended every year. Tetanus, diphtheria, and pertussis (Tdap) vaccine  You may need a Td booster every 10 years. Varicella (chickenpox) vaccine  You may need this if you have not been vaccinated. Zoster (shingles) vaccine  You may need  this after age 18. Measles, mumps, and rubella (MMR) vaccine  You may need at least one dose of MMR if you were born in 1957 or later. You may also need a second dose. Pneumococcal conjugate (PCV13) vaccine  You may need this if you have certain conditions and were not previously vaccinated. Pneumococcal polysaccharide (PPSV23) vaccine  You may need one or two doses if you smoke cigarettes or if you have certain conditions. Meningococcal conjugate (MenACWY) vaccine  You may need this if you have certain conditions. Hepatitis A vaccine  You may need this if you have certain conditions or if you travel or work in places where you may be exposed to hepatitis A. Hepatitis B vaccine  You may need this if you have certain conditions or if you travel or work in places where you may be exposed to hepatitis B. Haemophilus influenzae type b (Hib) vaccine  You may need this if you have certain conditions. Human papillomavirus (HPV) vaccine  If recommended by your health care provider, you may need three doses over 6 months. You may receive vaccines as individual doses or as more than one vaccine together in one shot (combination vaccines). Talk with your health care provider about the risks and benefits of combination vaccines. What tests do I need? Blood tests  Lipid and cholesterol levels. These may be checked every 5 years, or more frequently if you are over 20 years old.  Hepatitis C test.  Hepatitis B test. Screening  Lung cancer screening. You may have this screening every year starting at age 7 if you have a 30-pack-year  history of smoking and currently smoke or have quit within the past 15 years.  Colorectal cancer screening. All adults should have this screening starting at age 52 and continuing until age 73. Your health care provider may recommend screening at age 49 if you are at increased risk. You will have tests every 1-10 years, depending on your results and the type of  screening test.  Diabetes screening. This is done by checking your blood sugar (glucose) after you have not eaten for a while (fasting). You may have this done every 1-3 years.  Mammogram. This may be done every 1-2 years. Talk with your health care provider about when you should start having regular mammograms. This may depend on whether you have a family history of breast cancer.  BRCA-related cancer screening. This may be done if you have a family history of breast, ovarian, tubal, or peritoneal cancers.  Pelvic exam and Pap test. This may be done every 3 years starting at age 88. Starting at age 29, this may be done every 5 years if you have a Pap test in combination with an HPV test. Other tests  Sexually transmitted disease (STD) testing.  Bone density scan. This is done to screen for osteoporosis. You may have this scan if you are at high risk for osteoporosis. Follow these instructions at home: Eating and drinking  Eat a diet that includes fresh fruits and vegetables, whole grains, lean protein, and low-fat dairy.  Take vitamin and mineral supplements as recommended by your health care provider.  Do not drink alcohol if: ? Your health care provider tells you not to drink. ? You are pregnant, may be pregnant, or are planning to become pregnant.  If you drink alcohol: ? Limit how much you have to 0-1 drink a day. ? Be aware of how much alcohol is in your drink. In the [place] one drink equals one 12 oz bottle of beer (355 mL), one 5 oz glass of wine (148 mL), or one 1 oz glass of hard liquor (44 mL). Lifestyle  Take daily care of your teeth and gums.  Stay active. Exercise for at least 30 minutes on 5 or more days each week.  Do not use any products that contain nicotine or tobacco, such as cigarettes, e-cigarettes, and chewing tobacco. If you need help quitting, ask your health care provider.  If you are sexually active, practice safe sex. Use a condom or other form of birth  control (contraception) in order to prevent pregnancy and STIs (sexually transmitted infections).  If told by your health care provider, take low-dose aspirin daily starting at age 94. What's next?  Visit your health care provider once a year for a well check visit.  Ask your health care provider how often you should have your eyes and teeth checked.  Stay up to date on all vaccines. This information is not intended to replace advice given to you by your health care provider. Make sure you discuss any questions you have with your health care provider. Document Revised: 06/26/2018 Document Reviewed: 06/26/2018 Elsevier Patient Education  2020 [employer].

## 2020-05-17 ENCOUNTER — Encounter: Payer: Self-pay | Admitting: Physician Assistant

## 2020-05-17 MED ORDER — TRIAMCINOLONE ACETONIDE 0.1 % EX CREA
1.0000 | TOPICAL_CREAM | Freq: Two times a day (BID) | CUTANEOUS | 0 refills | Status: DC
Start: 2020-05-17 — End: 2021-02-24

## 2020-05-18 ENCOUNTER — Encounter: Payer: Self-pay | Admitting: Physician Assistant

## 2020-05-18 LAB — QUANTIFERON-TB GOLD PLUS
Mitogen-NIL: >10 IU/mL
NIL: 0.11 IU/mL
QuantiFERON-TB Gold Plus: NEGATIVE
TB1-NIL: 0.03 IU/mL
TB2-NIL: 0.04 IU/mL

## 2020-05-18 LAB — VALPROIC ACID LEVEL: Valproic Acid Lvl: 57.9 mg/L (ref 50.0–100.0)

## 2020-05-20 ENCOUNTER — Encounter: Payer: Self-pay | Admitting: Emergency Medicine

## 2020-06-04 ENCOUNTER — Other Ambulatory Visit: Payer: Self-pay | Admitting: Physician Assistant

## 2020-06-04 DIAGNOSIS — B002 Herpesviral gingivostomatitis and pharyngotonsillitis: Secondary | ICD-10-CM

## 2020-06-04 DIAGNOSIS — F3161 Bipolar disorder, current episode mixed, mild: Secondary | ICD-10-CM

## 2020-06-17 ENCOUNTER — Encounter: Payer: Self-pay | Admitting: Physician Assistant

## 2020-06-17 NOTE — Telephone Encounter (Signed)
She would have to have an appointment before we can send an antibiotic.  Since it is Friday she may need to be seen at a urgent care especially if symptoms are worsening and her dentist is unable to evaluate her.

## 2020-07-25 ENCOUNTER — Encounter: Payer: Self-pay | Admitting: Physician Assistant

## 2020-07-26 ENCOUNTER — Other Ambulatory Visit: Payer: Self-pay | Admitting: Emergency Medicine

## 2020-07-26 DIAGNOSIS — B002 Herpesviral gingivostomatitis and pharyngotonsillitis: Secondary | ICD-10-CM

## 2020-07-26 DIAGNOSIS — F3161 Bipolar disorder, current episode mixed, mild: Secondary | ICD-10-CM

## 2020-07-26 MED ORDER — ACYCLOVIR 800 MG PO TABS: ORAL_TABLET | ORAL | 1 refills | Status: DC

## 2020-07-26 MED ORDER — ARIPIPRAZOLE 5 MG PO TABS: 5.0000 mg | ORAL_TABLET | Freq: Every day | ORAL | 1 refills | Status: DC

## 2020-07-26 MED ORDER — DIVALPROEX SODIUM 250 MG PO DR TAB: 250.0000 mg | DELAYED_RELEASE_TABLET | Freq: Three times a day (TID) | ORAL | 1 refills | Status: DC

## 2020-07-26 MED ORDER — CITALOPRAM HYDROBROMIDE 20 MG PO TABS: 20.0000 mg | ORAL_TABLET | Freq: Every day | ORAL | 1 refills | Status: DC

## 2020-11-21 ENCOUNTER — Other Ambulatory Visit: Payer: Self-pay

## 2020-11-21 ENCOUNTER — Encounter: Payer: Self-pay | Admitting: Physician Assistant

## 2020-11-21 ENCOUNTER — Telehealth (INDEPENDENT_AMBULATORY_CARE_PROVIDER_SITE_OTHER): Payer: 59 | Admitting: Physician Assistant

## 2020-11-21 DIAGNOSIS — B9689 Other specified bacterial agents as the cause of diseases classified elsewhere: Secondary | ICD-10-CM | POA: Diagnosis not present

## 2020-11-21 DIAGNOSIS — J019 Acute sinusitis, unspecified: Secondary | ICD-10-CM | POA: Diagnosis not present

## 2020-11-21 MED ORDER — DOXYCYCLINE HYCLATE 100 MG PO TABS: 100.0000 mg | ORAL_TABLET | Freq: Two times a day (BID) | ORAL | 0 refills | Status: DC

## 2020-11-21 NOTE — Progress Notes (Signed)
Virtual Visit via Video   I connected with patient on 11/21/20 at 11:30 AM EST by a video enabled telemedicine application and verified that I am speaking with the correct person using two identifiers.  Location patient: Home Location provider: [facility] Persons participating in the virtual visit: Patient, Provider, CMA (Patina Moore)  I discussed the limitations of evaluation and management by telemedicine and the availability of in person appointments. The patient expressed understanding and agreed to proceed.  Subjective:   HPI:  Patient presents via [facility] today complaining of 1 week of left ear pain, now with significant sinus pressure, sinus pain and facial pain.  Did have some dizziness over initially that has resolved.  Notes substantial postnasal drainage and need to clear her throat.  Will occasionally cough up some of her drainage but denies true chest congestion.  Denies any shortness of breath or chest tightness.  Denies fever, chills, malaise or fatigue.  Denies recent travel or sick contact.  Has not taken anything for her symptoms.  ROS:   See pertinent positives and negatives per HPI.  Patient Active Problem List   Diagnosis Date Noted  . Breast cancer screening 09/02/2015  . Colon cancer screening 09/02/2015  . Recurrent oral herpes simplex 07/25/2014  . Bipolar disorder (HCC) 02/24/2014  . Visit for preventive health examination 02/24/2014  . Rheumatoid arthritis (HCC) 02/24/2014    Social History   Tobacco Use  . Smoking status: Former Smoker    Quit date: 06/30/2015    Years since quitting: 5.4  . Smokeless tobacco: Never Used  Substance Use Topics  . Alcohol use: Yes    Alcohol/week: 0.0 standard drinks    Comment: very rare    Current Outpatient Medications:  .  acyclovir (ZOVIRAX) 800 MG tablet, TAKE 1 TABLET(800 MG) BY MOUTH DAILY, Disp: 90 tablet, Rfl: 1 .  ARIPiprazole (ABILIFY) 5 MG tablet, Take 1 tablet (5 mg total) by  mouth daily., Disp: 90 tablet, Rfl: 1 .  Ascorbic Acid (VITAMIN C) 1000 MG tablet, Take 1,000 mg by mouth daily., Disp: , Rfl:  .  Bacillus Coagulans-Inulin (PROBIOTIC FORMULA) 1-250 BILLION-MG CAPS, Take by mouth daily., Disp: , Rfl:  .  citalopram (CELEXA) 20 MG tablet, Take 1 tablet (20 mg total) by mouth daily., Disp: 90 tablet, Rfl: 1 .  Cranberry-Vitamin C 84-20 MG CAPS, Take by mouth daily., Disp: , Rfl:  .  divalproex (DEPAKOTE) 250 MG DR tablet, Take 1 tablet (250 mg total) by mouth 3 (three) times daily., Disp: 270 tablet, Rfl: 1 .  Multiple Vitamins-Minerals (CENTRUM ADULTS) TABS, Take by mouth daily., Disp: , Rfl:  .  triamcinolone cream (KENALOG) 0.1 %, Apply 1 application topically 2 (two) times daily., Disp: 30 g, Rfl: 0  Allergies  Allergen Reactions  . Lithium Other (See Comments)    Boils  . Penicillins Hives    Objective:   There were no vitals taken for this visit.  Patient is well-developed, well-nourished in no acute distress.  Resting comfortably at home.  Head is normocephalic, atraumatic.  No labored breathing.  Speech is clear and coherent with logical content.  Patient is alert and oriented at baseline.   Assessment and Plan:   1. Acute bacterial sinusitis Rx Doxycycline.  Increase fluids.  Rest.  Saline nasal spray.  Probiotic.  Mucinex as directed.  Humidifier in bedroom. Start nasal steroid OTC.  Call or return to clinic if symptoms are not improving.     Piedad Climes,  PA-C 11/21/2020

## 2020-11-21 NOTE — Patient Instructions (Signed)
Instructions sent to patients MyChart.

## 2020-11-21 NOTE — Progress Notes (Signed)
I have discussed the procedure for the virtual visit with the patient who has given consent to proceed with assessment and treatment.   Stephanie Spencer, CMA     

## 2020-12-15 ENCOUNTER — Ambulatory Visit (INDEPENDENT_AMBULATORY_CARE_PROVIDER_SITE_OTHER): Payer: Self-pay | Admitting: Family Medicine

## 2020-12-15 ENCOUNTER — Encounter (INDEPENDENT_AMBULATORY_CARE_PROVIDER_SITE_OTHER): Payer: Self-pay

## 2020-12-17 ENCOUNTER — Encounter: Payer: Self-pay | Admitting: Physician Assistant

## 2020-12-17 DIAGNOSIS — F3161 Bipolar disorder, current episode mixed, mild: Secondary | ICD-10-CM

## 2020-12-18 NOTE — Telephone Encounter (Signed)
Do you remember talking to this patient?  She said she left a message.  I don't have vm.  I have already put the charge in.  I do remember her husband scheduling her appts with me.  Please advise.

## 2020-12-19 ENCOUNTER — Other Ambulatory Visit: Payer: Self-pay | Admitting: Emergency Medicine

## 2020-12-19 DIAGNOSIS — F3161 Bipolar disorder, current episode mixed, mild: Secondary | ICD-10-CM

## 2020-12-19 MED ORDER — CITALOPRAM HYDROBROMIDE 20 MG PO TABS: 20.0000 mg | ORAL_TABLET | Freq: Every day | ORAL | 1 refills | Status: DC

## 2020-12-23 ENCOUNTER — Other Ambulatory Visit: Payer: Self-pay | Admitting: Physician Assistant

## 2020-12-23 DIAGNOSIS — B002 Herpesviral gingivostomatitis and pharyngotonsillitis: Secondary | ICD-10-CM

## 2020-12-23 MED ORDER — DIVALPROEX SODIUM 250 MG PO DR TAB: 250.0000 mg | DELAYED_RELEASE_TABLET | Freq: Three times a day (TID) | ORAL | 0 refills | Status: DC

## 2020-12-29 ENCOUNTER — Ambulatory Visit (INDEPENDENT_AMBULATORY_CARE_PROVIDER_SITE_OTHER): Payer: Self-pay | Admitting: Family Medicine

## 2021-01-18 ENCOUNTER — Telehealth: Payer: Self-pay | Admitting: Physician Assistant

## 2021-01-18 NOTE — Telephone Encounter (Signed)
..  Medication Refills  Last OV:  Medication:  Celexa  Pharmacy:  [facility] at [facility] [employer] Let patient know to contact pharmacy at the end of the day to make sure medication is ready.   Please notify patient to allow 48-72 hours to process.  Encourage patient to contact the pharmacy for refills or they can request refills through [facility]  Clinical Fills out below:   Last refill:  QTY:  Refill Date:    Other Comments:   Okay for refill?  Please advise.

## 2021-01-18 NOTE — Telephone Encounter (Signed)
LMOVM to return call. 6 month script sent in last month

## 2021-02-24 ENCOUNTER — Other Ambulatory Visit: Payer: Self-pay

## 2021-02-24 ENCOUNTER — Encounter: Payer: Self-pay | Admitting: Family Medicine

## 2021-02-24 ENCOUNTER — Ambulatory Visit: Payer: 59 | Admitting: Family Medicine

## 2021-02-24 VITALS — BP 114/70 | HR 72 | Temp 97.7°F | Ht 63.0 in | Wt 186.8 lb

## 2021-02-24 DIAGNOSIS — L700 Acne vulgaris: Secondary | ICD-10-CM

## 2021-02-24 DIAGNOSIS — M069 Rheumatoid arthritis, unspecified: Secondary | ICD-10-CM

## 2021-02-24 DIAGNOSIS — F3161 Bipolar disorder, current episode mixed, mild: Secondary | ICD-10-CM | POA: Diagnosis not present

## 2021-02-24 DIAGNOSIS — B002 Herpesviral gingivostomatitis and pharyngotonsillitis: Secondary | ICD-10-CM | POA: Diagnosis not present

## 2021-02-24 DIAGNOSIS — A6000 Herpesviral infection of urogenital system, unspecified: Secondary | ICD-10-CM | POA: Insufficient documentation

## 2021-02-24 DIAGNOSIS — A6004 Herpesviral vulvovaginitis: Secondary | ICD-10-CM

## 2021-02-24 MED ORDER — DIVALPROEX SODIUM 250 MG PO DR TAB: 250.0000 mg | DELAYED_RELEASE_TABLET | Freq: Two times a day (BID) | ORAL | 3 refills | Status: DC

## 2021-02-24 MED ORDER — CITALOPRAM HYDROBROMIDE 20 MG PO TABS: 20.0000 mg | ORAL_TABLET | Freq: Every day | ORAL | 1 refills | Status: DC

## 2021-02-24 MED ORDER — ACYCLOVIR 800 MG PO TABS: 800.0000 mg | ORAL_TABLET | Freq: Every day | ORAL | 3 refills | Status: DC

## 2021-02-24 MED ORDER — ARIPIPRAZOLE 5 MG PO TABS: 5.0000 mg | ORAL_TABLET | Freq: Every day | ORAL | 1 refills | Status: DC

## 2021-02-24 NOTE — [place] Notes (Signed)
[facility] [address] [place] [address] Dept: [phone] Dept Fax: [phone]  Transfer of Care Office Visit  Subjective:    Patient ID: Stephanie Spencer, female    DOB: May 19, 1961, 60 y.o..   MRN: [id number]  Chief Complaint  Patient presents with  . Establish Care    TOC- needs medication refills. C/o having LT knee pain off/on x 1 year with last 10 days being worse. Also has a mole under the LT breast.  Has taken some Ibuprofen.    History of Present Illness:  Patient is in today to establish care. Ms. Xu is originally from [place]. She lived in [place] for some years before moving to [place] with her husband. She has 4 children from a previous marriage, 11 grandchildren, and 1 great-grandchild. She works in an Paramedic for [employer]. She quit smoking 2 years ago. She rarely drinks alcohol. She admits to regular vaping of marijuana. She uses this to reduce pain and inflammation related to her RA.  Ms. Kirkey has a history of rheumatoid arthritis. She notes that she had been previously treated with Humira and steroids at various times. She did not tolerate these approaches. She began using marijuana and has found that this usually helps with her joint inflammation. She uses marijuana as a vaping solution. She has considered the use of CBD, but wasn't sure of its role in this. Ms. Sievert has not seen a rheumatologist in quite some time.  Ms. Latendresse is having a current flare of pain in her left knee. She notes this feels tender laterally and often feels like the joint is tight and her pain could be relieved if she could pop the joint. She has not had any specific locking of the joint.  Ms. Sarti has a history of bipolar disorder. She notes that she had been on citalopram when in [place]. Once she moved to [facility], she found her mood was bothersome. She worked with Mr. Daphine Deutscher, her PA, and is not managed  additionally on Depakote and Abilify. She feels her bipolar is much more stable now.  Ms. Leamer has a history of recurrent oral herpes with frequent recurrence. She also admits to 1-2 flares of genital herpes each year. She uses daily acyclovir for suppressive therapy.  Past Medical History: Patient Active Problem List   Diagnosis Date Noted  . Genital herpes 02/24/2021  . Recurrent oral herpes simplex 07/25/2014  . Bipolar disorder (HCC) 02/24/2014  . Rheumatoid arthritis (HCC) 02/24/2014   Past Surgical History:  Procedure Laterality Date  . ABDOMINAL HYSTERECTOMY  2013  . AUGMENTATION MAMMAPLASTY    . BREAST ENHANCEMENT SURGERY  2005/1992  . FOOT SURGERY     Left, Bersa  . TONSILLECTOMY AND ADENOIDECTOMY  1972   Family History  Problem Relation Age of Onset  . Mental illness Mother        Living  . Dementia Mother   . Parkinson's disease Father        Deceased  . Alzheimer's disease Father   . Cancer Maternal Grandmother   . Diabetes Maternal Uncle   . Arthritis Maternal Aunt        Psoriatic   . Alcohol abuse Brother   . Healthy Brother   . Healthy Sister   . Allergies Daughter   . Healthy Daughter   . Healthy Son        x2  . Colon cancer Neg Hx    Outpatient Medications Prior  to Visit  Medication Sig Dispense Refill  . Ascorbic Acid (VITAMIN C) 1000 MG tablet Take 1,000 mg by mouth daily.    . Bacillus Coagulans-Inulin (PROBIOTIC FORMULA) 1-250 BILLION-MG CAPS Take by mouth daily.    . Cranberry-Vitamin C 84-20 MG CAPS Take by mouth daily.    . Multiple Vitamins-Minerals (CENTRUM ADULTS) TABS Take by mouth daily.    . vitamin B-12 (CYANOCOBALAMIN) 500 MCG tablet Take 500 mcg by mouth daily.    Marland Kitchen acyclovir (ZOVIRAX) 800 MG tablet TAKE 1 TABLET(800 MG) BY MOUTH DAILY 90 tablet 0  . ARIPiprazole (ABILIFY) 5 MG tablet Take 1 tablet (5 mg total) by mouth daily. 90 tablet 1  . citalopram (CELEXA) 20 MG tablet Take 1 tablet (20 mg total) by mouth daily. 90 tablet  1  . divalproex (DEPAKOTE) 250 MG DR tablet Take 1 tablet (250 mg total) by mouth 3 (three) times daily. (Patient taking differently: Take 250 mg by mouth 2 (two) times daily.) 270 tablet 0  . doxycycline (VIBRA-TABS) 100 MG tablet Take 1 tablet (100 mg total) by mouth 2 (two) times daily. (Patient not taking: Reported on 02/24/2021) 20 tablet 0  . triamcinolone cream (KENALOG) 0.1 % Apply 1 application topically 2 (two) times daily. (Patient not taking: Reported on 02/24/2021) 30 g 0   No facility-administered medications prior to visit.   Allergies  Allergen Reactions  . Lithium Other (See Comments)    Boils  . Penicillins Hives   Objective:   Today's Vitals   02/24/21 1458  BP: 114/70  Pulse: 72  Temp: 97.7 F (36.5 C)  TempSrc: Temporal  SpO2: 97%  Weight: 186 lb 12.8 oz (84.7 kg)  Height: 5\' 3"  (1.6 m)   Body mass index is 33.09 kg/m.   General: Well developed, well nourished. No acute distress. Extremities: Full ROM. No joint swelling or increased warmth. Pain indicated lateral to the upper pole of the patella.   Varus/valgus, LacHman's and McMurray's testing are all normal. No edema noted. Skin: Warm and dry. Small skin tag on upper abdomen. There is a 1 cm deep cyst noted under the skin of the left cheek. I   suspect this may be an acne-related cyst. There are a very telangiectasias overlying this, but no raised lesion. Psych: Alert and oriented. Normal mood and affect.  There are no preventive care reminders to display for this patient.    Assessment & Plan:   1. Bipolar disorder, current episode mixed, mild (HCC) Stable on current medicine regimen. She is not followed by psychiatry. iw ill continue these for now, but if becomes unstable, will look to referral for medical management.  - divalproex (DEPAKOTE) 250 MG DR tablet; Take 1 tablet (250 mg total) by mouth 2 (two) times daily.  Dispense: 180 tablet; Refill: 3 - citalopram (CELEXA) 20 MG tablet; Take 1 tablet  (20 mg total) by mouth daily.  Dispense: 90 tablet; Refill: 1 - ARIPiprazole (ABILIFY) 5 MG tablet; Take 1 tablet (5 mg total) by mouth daily.  Dispense: 90 tablet; Refill: 1  2. Rheumatoid arthritis involving multiple sites, unspecified whether rheumatoid factor present [facility]) Currently generally managed with THC. We did discuss the role CBDs may have in peripheral inflammation. The left knee may be a flare of RA, but would typically expect symmetrical inflammation. In this case, it may represent more of an OA issue. We discussed her taking Aleve 1 po bid for 7 days and using daily hot soaks with ROM. If not improved,  she should return to consider a possible x-ray and a steroid injection.  3. Acne cystica I will refer her to dermatology for evaluation of this apparent cyst.  - Ambulatory referral to Dermatology  4. Recurrent oral herpes simplex 5. Herpes simplex vulvovaginitis Continue daily suppressive therapy.  - acyclovir (ZOVIRAX) 800 MG tablet; Take 1 tablet (800 mg total) by mouth daily.  Dispense: 90 tablet; Refill: 3  Loyola Mast, MD

## 2021-03-06 NOTE — Telephone Encounter (Signed)
I have not spoken with this patient and I did not have any messages from her.

## 2021-04-21 ENCOUNTER — Ambulatory Visit: Payer: 59 | Admitting: Family Medicine

## 2021-04-21 ENCOUNTER — Other Ambulatory Visit: Payer: Self-pay

## 2021-04-21 ENCOUNTER — Encounter: Payer: Self-pay | Admitting: Family Medicine

## 2021-04-21 VITALS — BP 106/64 | HR 71 | Temp 97.4°F | Ht 63.0 in | Wt 191.2 lb

## 2021-04-21 DIAGNOSIS — M1712 Unilateral primary osteoarthritis, left knee: Secondary | ICD-10-CM

## 2021-04-21 NOTE — [place] Notes (Signed)
[facility] [address] [place] [address] Dept: [phone] Dept Fax: [phone]  Office Visit  Subjective:    Patient ID: Stephanie Spencer, female    DOB: 06-25-1961, 60 y.o..   MRN: [id number]  Chief Complaint  Patient presents with   Acute Visit    C/o having LT knee pain x months.  She has been using a brace, heat, ice, and Ibuprofen with little relief.    History of Present Illness:  Patient is in today for reassessment of her left knee joint pain. At her last visit, Ms. Shellhammer was having a flare of left knee pain. Although she has a history of RA, then seemed to be more of an osteoarthritis pattern, as there was no symmetrical arthritis and no other joints bothering her. She was advised to use Aleve and hot soaks with ROM. This has not relieved her discomfort. She notes she has had other cortisone injections in the past.  Past Medical History: Patient Active Problem List   Diagnosis Date Noted   Genital herpes 02/24/2021   Recurrent oral herpes simplex 07/25/2014   Bipolar disorder (HCC) 02/24/2014   Rheumatoid arthritis (HCC) 02/24/2014   Past Surgical History:  Procedure Laterality Date   ABDOMINAL HYSTERECTOMY  2013   AUGMENTATION MAMMAPLASTY     BREAST ENHANCEMENT SURGERY  2005/1992   FOOT SURGERY     Left, Bersa   TONSILLECTOMY AND ADENOIDECTOMY  1972   Family History  Problem Relation Age of Onset   Mental illness Mother        Living   Dementia Mother    Parkinson's disease Father        Deceased   Alzheimer's disease Father    Cancer Maternal Grandmother    Diabetes Maternal Uncle    Arthritis Maternal Aunt        Psoriatic    Alcohol abuse Brother    Healthy Brother    Healthy Sister    Allergies Daughter    Healthy Daughter    Healthy Son        x2   Colon cancer Neg Hx    Outpatient Medications Prior to Visit  Medication Sig Dispense Refill   acyclovir (ZOVIRAX) 800 MG tablet  Take 1 tablet (800 mg total) by mouth daily. 90 tablet 3   ARIPiprazole (ABILIFY) 5 MG tablet Take 1 tablet (5 mg total) by mouth daily. 90 tablet 1   Ascorbic Acid (VITAMIN C) 1000 MG tablet Take 1,000 mg by mouth daily.     Bacillus Coagulans-Inulin (PROBIOTIC FORMULA) 1-250 BILLION-MG CAPS Take by mouth daily.     citalopram (CELEXA) 20 MG tablet Take 1 tablet (20 mg total) by mouth daily. 90 tablet 1   Cranberry-Vitamin C 84-20 MG CAPS Take by mouth daily.     divalproex (DEPAKOTE) 250 MG DR tablet Take 1 tablet (250 mg total) by mouth 2 (two) times daily. 180 tablet 3   Multiple Vitamins-Minerals (CENTRUM ADULTS) TABS Take by mouth daily.     vitamin B-12 (CYANOCOBALAMIN) 500 MCG tablet Take 500 mcg by mouth daily.     No facility-administered medications prior to visit.   Allergies  Allergen Reactions   Lithium Other (See Comments)    Boils   Penicillins Hives   Objective:   Today's Vitals   04/21/21 1557  BP: 106/64  Pulse: 71  Temp: (!) 97.4 F (36.3 C)  TempSrc: Temporal  SpO2: 99%  Weight: 191 lb 3.2 oz (86.7  kg)  Height: 5\' 3"  (1.6 m)   Body mass index is 33.87 kg/m.   General: Well developed, well nourished. No acute distress. Left knee: Bony hypertrophy of the knee. No increased warmth or swelling noted.  Psych: Alert and oriented. Normal mood and affect.  Health Maintenance Due  Topic Date Due   Zoster Vaccines- Shingrix (1 of 2) Never done   COVID-19 Vaccine (4 - Booster for Pfizer series) 02/05/2021   MAMMOGRAM  03/18/2021     Assessment & Plan:   1. Primary osteoarthritis of left knee Ms. Gatchel failed conservative options. We discussed proceeding with an intra-articular steroid injection. However, I discovered the methylprednisolone we had on site was out of date from when the vial was opened. We had no other vials available. We are able to secure a vial from another practice, but it will be next week before we can get this. We have rescheduled MS.  Sterbenz to come then for her injection.  Orvan Falconer, MD

## 2021-04-25 ENCOUNTER — Other Ambulatory Visit: Payer: Self-pay

## 2021-04-25 ENCOUNTER — Encounter: Payer: Self-pay | Admitting: Family Medicine

## 2021-04-25 ENCOUNTER — Ambulatory Visit (INDEPENDENT_AMBULATORY_CARE_PROVIDER_SITE_OTHER): Payer: 59 | Admitting: Family Medicine

## 2021-04-25 VITALS — BP 100/64 | HR 77 | Temp 98.7°F | Ht 63.0 in | Wt 190.0 lb

## 2021-04-25 DIAGNOSIS — M1712 Unilateral primary osteoarthritis, left knee: Secondary | ICD-10-CM

## 2021-04-25 MED ORDER — LIDOCAINE HCL 2 % IJ SOLN
1.5000 mL | Freq: Once | INTRAMUSCULAR | Status: AC
  Administered 2021-05-11: 30 mg

## 2021-04-25 MED ORDER — METHYLPREDNISOLONE ACETATE 80 MG/ML IJ SUSP
80.0000 mg | Freq: Once | INTRAMUSCULAR | Status: AC
  Administered 2021-05-11: 80 mg via INTRA_ARTICULAR

## 2021-04-25 NOTE — Patient Instructions (Signed)
Joint Steroid Injection A joint steroid injection is a procedure to relieve swelling and pain in a joint. Steroids are medicines that reduce inflammation. In this procedure, your health care provider uses a syringe and a needle to inject a steroid medicine into a painful and inflamed joint. A pain-relieving medicine (anesthetic) may be injected along with the steroid. In some cases, your health care provider may use an imaging technique such as ultrasound or fluoroscopy toguide the injection. Joints that are often treated with steroid injections include the knee, shoulder, hip, and spine. These injections may also be used in the elbow, ankle, and joints of the hands or feet. You may have joint steroid injections as part of your treatment for inflammation caused by: Gout. Rheumatoid arthritis. Advanced wear-and-tear arthritis (osteoarthritis). Tendinitis. Bursitis. Joint steroid injections may be repeated, but having them too often can damage a joint or the skin over the joint. You should not have joint steroidinjections less than 6 weeks apart or more than four times a year. Tell a health care provider about: Any allergies you have. All medicines you are taking, including vitamins, herbs, eye drops, creams, and over-the-counter medicines. Any problems you or family members have had with anesthetic medicines. Any blood disorders you have. Any surgeries you have had. Any medical conditions you have. Whether you are pregnant or may be pregnant. What are the risks? Generally, this is a safe treatment. However, problems may occur, including: Infection. Bleeding. Allergic reactions to medicines. Damage to the joint or tissues around the joint. Thinning of skin or loss of skin color over the joint. Temporary flushing of the face or chest. Temporary increase in pain. Temporary increase in blood sugar. Failure to relieve inflammation or pain. What happens before the treatment? Medicines Ask  your health care provider about: Changing or stopping your regular medicines. This is especially important if you are taking diabetes medicines or blood thinners. Taking medicines such as aspirin and ibuprofen. These medicines can thin your blood. Do not take these medicines unless your health care provider tells you to take them. Taking over-the-counter medicines, vitamins, herbs, and supplements. General instructions You may have imaging tests of your joint. Ask your health care provider if you can drive yourself home after the procedure. What happens during the treatment?  Your health care provider will position you for the injection and locate the injection site over your joint. The skin over the joint will be cleaned with a germ-killing soap. Your health care provider may: Spray a numbing solution (topical anesthetic) over the injection site. Inject a local anesthetic under the skin above your joint. The needle will be placed through your skin into your joint. Your health care provider may use imaging to guide the needle to the right spot for the injection. If imaging is used, a special contrast dye may be injected to confirm that the needle is in the correct location. The steroid medicine will be injected into your joint. Anesthetic may be injected along with the steroid. This may be a medicine that relieves pain for a short time (short-acting anesthetic) or for a longer time (long-acting anesthetic). The needle will be removed, and an adhesive bandage (dressing) will be placed over the injection site. The procedure may vary among health care providers and hospitals. What can I expect after the treatment? You will be able to go home after the treatment. It is normal to feel slight flushing for a few days after the injection. After the treatment, it is common to   have an increase in joint pain after the anesthetic has worn off. This may happen about an hour after a short-acting anesthetic  or about 8 hours after a longer-acting anesthetic. You should begin to feel relief from joint pain and swelling after 24 to 48 hours. Contact your health care provider if you do not begin to feel relief after 2 days. Follow these instructions at home: Injection site care Leave the adhesive dressing over your injection site in place until your health care provider says you can remove it. Check your injection site every day for signs of infection. Check for: More redness, swelling, or pain. Fluid or blood. Warmth. Pus or a bad smell. Activity Return to your normal activities as told by your health care provider. Ask your health care provider what activities are safe for you. You may be asked to limit activities that put stress on the joint for a few days. Do joint exercises as told by your health care provider. Do not take baths, swim, or use a hot tub until your health care provider approves. Ask your health care provider if you may take showers. You may only be allowed to take sponge baths. Managing pain, stiffness, and swelling  If directed, put ice on the joint. To do this: Put ice in a plastic bag. Place a towel between your skin and the bag. Leave the ice on for 20 minutes, 2-3 times a day. Remove the ice if your skin turns bright red. This is very important. If you cannot feel pain, heat, or cold, you have a greater risk of damage to the area. Raise (elevate) your joint above the level of your heart when you are sitting or lying down.  General instructions Take over-the-counter and prescription medicines only as told by your health care provider. Do not use any products that contain nicotine or tobacco, such as cigarettes, e-cigarettes, and chewing tobacco. These can delay joint healing. If you need help quitting, ask your health care provider. If you have diabetes, be aware that your blood sugar may be slightly elevated for several days after the injection. Keep all follow-up visits.  This is important. Contact a health care provider if you have: Chills or a fever. Any signs of infection at your injection site. Increased pain or swelling or no relief after 2 days. Summary A joint steroid injection is a treatment to relieve pain and swelling in a joint. Steroids are medicines that reduce inflammation. Your health care provider may add an anesthetic along with the steroid. You may have joint steroid injections as part of your arthritis treatment. Joint steroid injections may be repeated, but having them too often can damage a joint or the skin over the joint. Contact your health care provider if you have a fever, chills, or signs of infection, or if you get no relief from joint pain or swelling. This information is not intended to replace advice given to you by your health care provider. Make sure you discuss any questions you have with your healthcare provider. Document Revised: 03/25/2020 Document Reviewed: 03/25/2020 Elsevier Patient Education  2022 Elsevier Inc.  

## 2021-04-25 NOTE — [place] Notes (Signed)
[facility] [address] [place] [address] Dept: [phone] Dept Fax: [phone]  Office Visit  Subjective:    Patient ID: Stephanie Spencer, female    DOB: 05/26/1961, 60 y.o..   MRN: [id number]  Chief Complaint  Patient presents with   Knee Pain    Left Knee   History of Present Illness:  Patient is in today for a knee joint injection. At her visit in late April, Stephanie Spencer was having a flare of left knee pain. Although she has a history of RA, then seemed to be more of an osteoarthritis pattern, as there was no symmetrical arthritis and no other joints bothering her. She was advised to use Aleve and hot soaks with ROM. This has not relieved her discomfort. She notes she has had other cortisone injections in the past. She was seen last week for re-evaluation. She returns today for intra-articular steroid injection.  Past Medical History: Patient Active Problem List   Diagnosis Date Noted   Genital herpes 02/24/2021   Recurrent oral herpes simplex 07/25/2014   Bipolar disorder (HCC) 02/24/2014   Rheumatoid arthritis (HCC) 02/24/2014   Past Surgical History:  Procedure Laterality Date   ABDOMINAL HYSTERECTOMY  2013   AUGMENTATION MAMMAPLASTY     BREAST ENHANCEMENT SURGERY  2005/1992   FOOT SURGERY     Left, Bersa   TONSILLECTOMY AND ADENOIDECTOMY  1972   Family History  Problem Relation Age of Onset   Mental illness Mother        Living   Dementia Mother    Parkinson's disease Father        Deceased   Alzheimer's disease Father    Cancer Maternal Grandmother    Diabetes Maternal Uncle    Arthritis Maternal Aunt        Psoriatic    Alcohol abuse Brother    Healthy Brother    Healthy Sister    Allergies Daughter    Healthy Daughter    Healthy Son        x2   Colon cancer Neg Hx    Outpatient Medications Prior to Visit  Medication Sig Dispense Refill   acyclovir (ZOVIRAX) 800 MG tablet Take 1  tablet (800 mg total) by mouth daily. 90 tablet 3   ARIPiprazole (ABILIFY) 5 MG tablet Take 1 tablet (5 mg total) by mouth daily. 90 tablet 1   Ascorbic Acid (VITAMIN C) 1000 MG tablet Take 1,000 mg by mouth daily.     Bacillus Coagulans-Inulin (PROBIOTIC FORMULA) 1-250 BILLION-MG CAPS Take by mouth daily.     citalopram (CELEXA) 20 MG tablet Take 1 tablet (20 mg total) by mouth daily. 90 tablet 1   Cranberry-Vitamin C 84-20 MG CAPS Take by mouth daily.     divalproex (DEPAKOTE) 250 MG DR tablet Take 1 tablet (250 mg total) by mouth 2 (two) times daily. 180 tablet 3   Multiple Vitamins-Minerals (CENTRUM ADULTS) TABS Take by mouth daily.     vitamin B-12 (CYANOCOBALAMIN) 500 MCG tablet Take 500 mcg by mouth daily.     No facility-administered medications prior to visit.   Allergies  Allergen Reactions   Lithium Other (See Comments)    Boils   Penicillins Hives   Objective:   Today's Vitals   04/25/21 1556  BP: 100/64  Pulse: 77  Temp: 98.7 F (37.1 C)  SpO2: 97%  Weight: 190 lb (86.2 kg)  Height: 5\' 3"  (1.6 m)   Body mass index is  33.66 kg/m.   General: Well developed, well nourished. No acute distress. Left knee: Bony hypertrophy of the knee. No increased warmth or swelling noted.  Psych: Alert and oriented. Normal mood and affect.  Health Maintenance Due  Topic Date Due   Zoster Vaccines- Shingrix (1 of 2) Never done   COVID-19 Vaccine (4 - Booster for Pfizer series) 02/05/2021   MAMMOGRAM  03/18/2021   PROCEDURE- Steroid Joint Injection Indication: Osteoarthritis Joint: Left knee joint Medication: Methylprednisolone 80mg /ml 1 ml.; lidocaine 2% 1.5 ml  PARQ reviewed with patient. Consent signed. Injection site cleaned with betadine. Needle introduced into joint space without difficulty. Medication administered and needle removed. Sterile bandage applied. Joint moved through gentle range of motion. Patient tolerated procedure well. Routine post injection care  instructions reviewed and provided to patient.    Assessment & Plan:   1. Primary osteoarthritis of left knee Injection as noted above. Aftercare instructions provided to the patient. Follow-up in 2 weeks if not improving.  , MD

## 2021-05-08 ENCOUNTER — Encounter: Payer: Self-pay | Admitting: Family Medicine

## 2021-05-08 DIAGNOSIS — M1712 Unilateral primary osteoarthritis, left knee: Secondary | ICD-10-CM

## 2021-05-16 ENCOUNTER — Ambulatory Visit (INDEPENDENT_AMBULATORY_CARE_PROVIDER_SITE_OTHER): Payer: 59

## 2021-05-16 ENCOUNTER — Ambulatory Visit (INDEPENDENT_AMBULATORY_CARE_PROVIDER_SITE_OTHER): Payer: 59 | Admitting: Orthopaedic Surgery

## 2021-05-16 ENCOUNTER — Other Ambulatory Visit: Payer: Self-pay

## 2021-05-16 DIAGNOSIS — G8929 Other chronic pain: Secondary | ICD-10-CM

## 2021-05-16 DIAGNOSIS — M25562 Pain in left knee: Secondary | ICD-10-CM

## 2021-05-16 NOTE — Progress Notes (Signed)
Office Visit Note   Patient: Stephanie Spencer           Date of Birth: 1961-10-05           MRN: [id number] Visit Date: 05/16/2021              Requested by: Loyola Mast, MD [address] [place],  [place] [address] PCP: Loyola Mast, MD   Assessment & Plan: Visit Diagnoses:  1. Chronic pain of left knee     Plan: Impression is chronic left knee pain concerning for questionable lateral meniscus tear versus tibial plateau fracture.  At this point we recommended MRI of the left knee to assess for structural abnormalities.  She will follow-up with [place] once that is been completed.  Follow-Up Instructions: Return for after MRI.   Orders:  Orders Placed This Encounter  Procedures   XR KNEE 3 VIEW LEFT   MR Knee Left w/o contrast   No orders of the defined types were placed in this encounter.     Procedures: No procedures performed   Clinical Data: No additional findings.   Subjective: Chief Complaint  Patient presents with   Left Knee - Pain    HPI patient is a pleasant 60 year old female who comes in today with left knee pain.  She sustained a mechanical fall about 2 to 3 months ago landing on her left knee.  Few weeks later she noticed pain to the lateral aspect.  She feels as though there is a pressure type feeling deep within the knee.  Pain is worse with flexion as well as going from a seated to standing position.  She has taken over-the-counter pain medication without relief.  She was seen by her PCP about 2 to 3 weeks ago where cortisone injection was performed.  She denies any relief following the injection.  Review of Systems as detailed in HPI.  All others reviewed and are negative.   Objective: Vital Signs: There were no vitals taken for this visit.  Physical Exam well-developed and well-nourished female in no acute distress.  Alert and oriented x3.  Ortho Exam left knee exam shows a small effusion.  Range of motion 0 to 110 degrees.   Lateral joint line tenderness as well as tenderness to the lateral tibial plateau.  Ligaments are stable.  She is neurovascular intact distally.  Specialty Comments:  No specialty comments available.  Imaging: No results found.   PMFS History: Patient Active Problem List   Diagnosis Date Noted   Genital herpes 02/24/2021   Recurrent oral herpes simplex 07/25/2014   Bipolar disorder (HCC) 02/24/2014   Rheumatoid arthritis (HCC) 02/24/2014   Past Medical History:  Diagnosis Date   Chicken pox    Chronic rheumatic arthritis (HCC)    GERD (gastroesophageal reflux disease)    Hemorrhoids    Hyperlipidemia    Borderline   Mumps    RLS (restless legs syndrome)    Urinary incontinence    Leakage   UTI (lower urinary tract infection)     Family History  Problem Relation Age of Onset   Mental illness Mother        Living   Dementia Mother    Parkinson's disease Father        Deceased   Alzheimer's disease Father    Cancer Maternal Grandmother    Diabetes Maternal Uncle    Arthritis Maternal Aunt        Psoriatic    Alcohol abuse Brother  Healthy Brother    Healthy Sister    Allergies Daughter    Healthy Daughter    Healthy Son        x2   Colon cancer Neg Hx     Past Surgical History:  Procedure Laterality Date   ABDOMINAL HYSTERECTOMY  2013   AUGMENTATION MAMMAPLASTY     BREAST ENHANCEMENT SURGERY  2005/1992   FOOT SURGERY     Left, [place]   TONSILLECTOMY AND ADENOIDECTOMY  1972   Social History   Occupational History   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

## 2021-05-23 ENCOUNTER — Encounter: Payer: Self-pay | Admitting: Orthopaedic Surgery

## 2021-06-09 ENCOUNTER — Ambulatory Visit
Admission: RE | Admit: 2021-06-09 | Discharge: 2021-06-09 | Disposition: A | Discharge status: Home / Self Care | Payer: 59 | Source: Ambulatory Visit | Attending: Orthopaedic Surgery | Admitting: Orthopaedic Surgery

## 2021-06-09 ENCOUNTER — Other Ambulatory Visit: Payer: Self-pay

## 2021-06-09 DIAGNOSIS — G8929 Other chronic pain: Secondary | ICD-10-CM

## 2021-06-12 NOTE — Progress Notes (Signed)
Needs appt

## 2021-06-12 NOTE — Progress Notes (Signed)
APPT MADE

## 2021-06-22 ENCOUNTER — Other Ambulatory Visit: Payer: Self-pay

## 2021-06-22 ENCOUNTER — Ambulatory Visit: Payer: 59 | Admitting: Orthopaedic Surgery

## 2021-06-22 ENCOUNTER — Encounter: Payer: Self-pay | Admitting: Orthopaedic Surgery

## 2021-06-22 DIAGNOSIS — S83242A Other tear of medial meniscus, current injury, left knee, initial encounter: Secondary | ICD-10-CM | POA: Diagnosis not present

## 2021-06-22 NOTE — Addendum Note (Signed)
Addended by: Mayra Reel on: 06/22/2021 09:16 AM   Modules accepted: Level of Service

## 2021-06-22 NOTE — Progress Notes (Signed)
Office Visit Note   Patient: Stephanie Spencer           Date of Birth: 1961-08-01           MRN: [id number] Visit Date: 06/22/2021              Requested by: Loyola Mast, MD [address] [place],  [place] [address] PCP: Loyola Mast, MD   Assessment & Plan: Visit Diagnoses:  1. Acute medial meniscus tear of left knee, initial encounter     Plan: MRI shows a horizontal tear of the medial meniscus and posterior horn.  Mild chondromalacia.  This resulted from a fall back in May which has not gotten better with time and conservative treatments such as compression knee brace, activity modifications, cortisone injection.  Given these findings I have recommended arthroscopic partial medial meniscectomy and chondroplasty as indicated.  Risk benefits rehab recovery alternatives to surgery reviewed with the patient.  Questions encouraged and answered.  Follow-Up Instructions: No follow-ups on file.   Orders:  No orders of the defined types were placed in this encounter.  No orders of the defined types were placed in this encounter.     Procedures: No procedures performed   Clinical Data: No additional findings.   Subjective: Chief Complaint  Patient presents with   Left Knee - Pain    Ms. Lacina returns today for MRI review of the left knee.  She states overall her symptoms have gotten worse.  Cortisone injection has been ineffective.   Review of Systems   Objective: Vital Signs: There were no vitals taken for this visit.  Physical Exam  Ortho Exam Left knee shows no joint effusion.  Significant medial joint line tenderness and a positive McMurray and increased pain with flexion of the knee past 100 degrees. Specialty Comments:  No specialty comments available.  Imaging: No results found.   PMFS History: Patient Active Problem List   Diagnosis Date Noted   Genital herpes 02/24/2021   Recurrent oral herpes simplex 07/25/2014   Bipolar  disorder (HCC) 02/24/2014   Rheumatoid arthritis (HCC) 02/24/2014   Past Medical History:  Diagnosis Date   Chicken pox    Chronic rheumatic arthritis (HCC)    GERD (gastroesophageal reflux disease)    Hemorrhoids    Hyperlipidemia    Borderline   Mumps    RLS (restless legs syndrome)    Urinary incontinence    Leakage   UTI (lower urinary tract infection)     Family History  Problem Relation Age of Onset   Mental illness Mother        Living   Dementia Mother    Parkinson's disease Father        Deceased   Alzheimer's disease Father    Cancer Maternal Grandmother    Diabetes Maternal Uncle    Arthritis Maternal Aunt        Psoriatic    Alcohol abuse Brother    Healthy Brother    Healthy Sister    Allergies Daughter    Healthy Daughter    Healthy Son        x2   Colon cancer Neg Hx     Past Surgical History:  Procedure Laterality Date   ABDOMINAL HYSTERECTOMY  2013   AUGMENTATION MAMMAPLASTY     BREAST ENHANCEMENT SURGERY  2005/1992   FOOT SURGERY     Left, [place]   TONSILLECTOMY AND ADENOIDECTOMY  1972   Social History   Occupational History  Occupation: Film/video editor    Comment: Engineer, manufacturing systems  Tobacco Use   Smoking status: Former    Types: Cigarettes    Quit date: 06/30/2015    Years since quitting: 5.9   Smokeless tobacco: Never  Vaping Use   Vaping Use: Some days   Substances: THC, CBD  Substance and Sexual Activity   Alcohol use: Yes    Alcohol/week: 0.0 standard drinks    Comment: very rare   Drug use: Yes    Types: Marijuana    Comment: 3-4 times per week   Sexual activity: Yes    Birth control/protection: Surgical

## 2021-06-26 ENCOUNTER — Other Ambulatory Visit: Payer: Self-pay | Admitting: Physician Assistant

## 2021-06-26 MED ORDER — HYDROCODONE-ACETAMINOPHEN 5-325 MG PO TABS: 1.0000 | ORAL_TABLET | Freq: Three times a day (TID) | ORAL | 0 refills | Status: DC | PRN

## 2021-06-26 MED ORDER — ONDANSETRON HCL 4 MG PO TABS: 4.0000 mg | ORAL_TABLET | Freq: Three times a day (TID) | ORAL | 0 refills | Status: DC | PRN

## 2021-06-29 ENCOUNTER — Encounter: Payer: Self-pay | Admitting: Orthopaedic Surgery

## 2021-06-29 DIAGNOSIS — S83242A Other tear of medial meniscus, current injury, left knee, initial encounter: Secondary | ICD-10-CM

## 2021-06-29 DIAGNOSIS — M659 Synovitis and tenosynovitis, unspecified: Secondary | ICD-10-CM

## 2021-07-06 ENCOUNTER — Encounter: Payer: Self-pay | Admitting: Orthopaedic Surgery

## 2021-07-06 ENCOUNTER — Ambulatory Visit (INDEPENDENT_AMBULATORY_CARE_PROVIDER_SITE_OTHER): Payer: 59 | Admitting: Physician Assistant

## 2021-07-06 ENCOUNTER — Other Ambulatory Visit: Payer: Self-pay

## 2021-07-06 DIAGNOSIS — Z9889 Other specified postprocedural states: Secondary | ICD-10-CM

## 2021-07-06 NOTE — Progress Notes (Signed)
Post-Op Visit Note   Patient: Stephanie Spencer           Date of Birth: 1961-04-25           MRN: [id number] Visit Date: 07/06/2021 PCP: Loyola Mast, MD   Assessment & Plan:  Chief Complaint:  Chief Complaint  Patient presents with   Left Knee - Pain   Visit Diagnoses:  1. S/P arthroscopy of left knee     Plan: Patient is a pleasant 60 year old female who comes in today 1 week out left knee arthroscopic debridement medial meniscus and chondroplasty medial femoral condyle, date of surgery 06/29/2021.  She has been doing great.  No pain.  Examination of the left knee reveals fully healed surgical portals with nylon sutures in place.  No evidence of infection or cellulitis.  Calf is soft and nontender.  She is neurovascular intact distally.  Today, sutures were removed and Steri-Strips applied.  Intraoperative pictures reviewed.  Range of motion exercises provided.  She may increase activity as tolerated.  Work note provided to return next Wednesday without restrictions as she is an Production designer, theatre/television/film.  Follow-up with [place] in 5 weeks time for recheck.   Follow-Up Instructions: Return in about 5 weeks (around 08/10/2021).   Orders:  No orders of the defined types were placed in this encounter.  No orders of the defined types were placed in this encounter.   Imaging: No new imaging  PMFS History: Patient Active Problem List   Diagnosis Date Noted   Genital herpes 02/24/2021   Recurrent oral herpes simplex 07/25/2014   Bipolar disorder (HCC) 02/24/2014   Rheumatoid arthritis (HCC) 02/24/2014   Past Medical History:  Diagnosis Date   Chicken pox    Chronic rheumatic arthritis (HCC)    GERD (gastroesophageal reflux disease)    Hemorrhoids    Hyperlipidemia    Borderline   Mumps    RLS (restless legs syndrome)    Urinary incontinence    Leakage   UTI (lower urinary tract infection)     Family History  Problem Relation Age of Onset   Mental illness Mother        Living    Dementia Mother    Parkinson's disease Father        Deceased   Alzheimer's disease Father    Cancer Maternal Grandmother    Diabetes Maternal Uncle    Arthritis Maternal Aunt        Psoriatic    Alcohol abuse Brother    Healthy Brother    Healthy Sister    Allergies Daughter    Healthy Daughter    Healthy Son        x2   Colon cancer Neg Hx     Past Surgical History:  Procedure Laterality Date   ABDOMINAL HYSTERECTOMY  2013   AUGMENTATION MAMMAPLASTY     BREAST ENHANCEMENT SURGERY  2005/1992   FOOT SURGERY     Left, [place]ers[place]   TONSILLECTOMY AND ADENOIDECTOMY  1972   Social History   Occupational History   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

## 2021-08-08 LAB — HM DIABETES EYE EXAM

## 2021-08-10 ENCOUNTER — Other Ambulatory Visit: Payer: Self-pay

## 2021-08-10 ENCOUNTER — Ambulatory Visit: Payer: 59 | Admitting: Orthopaedic Surgery

## 2021-08-10 ENCOUNTER — Encounter: Payer: Self-pay | Admitting: Orthopaedic Surgery

## 2021-08-10 DIAGNOSIS — Z9889 Other specified postprocedural states: Secondary | ICD-10-CM

## 2021-08-10 MED ORDER — NAPROXEN 500 MG PO TABS
500.0000 mg | ORAL_TABLET | Freq: Two times a day (BID) | ORAL | 3 refills | Status: DC
Start: 2021-08-10 — End: 2021-10-11

## 2021-08-10 NOTE — Progress Notes (Signed)
Post-Op Visit Note   Patient: Stephanie Spencer           Date of Birth: December 14, 1960           MRN: [id number] Visit Date: 08/10/2021 PCP: Loyola Mast, MD   Assessment & Plan:  Chief Complaint:  Chief Complaint  Patient presents with   Left Knee - Routine Post Op   Visit Diagnoses:  1. S/P arthroscopy of left knee     Plan: Stephanie Spencer is approximately 6 weeks status post left knee arthroscopy with debridement of the medial meniscus.  She is doing well and not reporting any pain or mechanical symptoms but she feels like a "water sloshing "feeling.  She does not feel like there is any swelling.  Her previous symptoms are gone.  Left knee shows trace effusion.  No jointline tenderness.  Normal range of motion.  Slight tenderness to the healed portal sites.  Stephanie Spencer is now 6 weeks status post left knee arthroscopy.  I am not exactly sure what to make of the water sloshing feeling but she is not reporting any pain or mechanical symptoms.  I recommend Naprosyn for 2 weeks and close monitoring of the symptoms to see if this will get worse.  Otherwise she can follow-up as needed.  She is very happy with overall outcome and is doing very well and not limited.  Follow-Up Instructions: No follow-ups on file.   Orders:  No orders of the defined types were placed in this encounter.  Meds ordered this encounter  Medications   naproxen (NAPROSYN) 500 MG tablet    Sig: Take 1 tablet (500 mg total) by mouth 2 (two) times daily with a meal.    Dispense:  30 tablet    Refill:  3    Imaging: No results found.  PMFS History: Patient Active Problem List   Diagnosis Date Noted   Genital herpes 02/24/2021   Recurrent oral herpes simplex 07/25/2014   Bipolar disorder (HCC) 02/24/2014   Rheumatoid arthritis (HCC) 02/24/2014   Past Medical History:  Diagnosis Date   Chicken pox    Chronic rheumatic arthritis (HCC)    GERD (gastroesophageal reflux disease)    Hemorrhoids     Hyperlipidemia    Borderline   Mumps    RLS (restless legs syndrome)    Urinary incontinence    Leakage   UTI (lower urinary tract infection)     Family History  Problem Relation Age of Onset   Mental illness Mother        Living   Dementia Mother    Parkinson's disease Father        Deceased   Alzheimer's disease Father    Cancer Maternal Grandmother    Diabetes Maternal Uncle    Arthritis Maternal Aunt        Psoriatic    Alcohol abuse Brother    Healthy Brother    Healthy Sister    Allergies Daughter    Healthy Daughter    Healthy Son        x2   Colon cancer Neg Hx     Past Surgical History:  Procedure Laterality Date   ABDOMINAL HYSTERECTOMY  2013   AUGMENTATION MAMMAPLASTY     BREAST ENHANCEMENT SURGERY  2005/1992   FOOT SURGERY     Left, [place]ers[place]   TONSILLECTOMY AND ADENOIDECTOMY  1972   Social History   Occupational History   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

## 2021-08-14 ENCOUNTER — Other Ambulatory Visit: Payer: Self-pay | Admitting: Family Medicine

## 2021-08-14 DIAGNOSIS — Z1231 Encounter for screening mammogram for malignant neoplasm of breast: Secondary | ICD-10-CM

## 2021-08-18 ENCOUNTER — Other Ambulatory Visit: Payer: Self-pay | Admitting: Family Medicine

## 2021-08-18 DIAGNOSIS — F3161 Bipolar disorder, current episode mixed, mild: Secondary | ICD-10-CM

## 2021-08-25 ENCOUNTER — Other Ambulatory Visit: Payer: Self-pay

## 2021-08-25 ENCOUNTER — Encounter: Payer: Self-pay | Admitting: Family Medicine

## 2021-08-25 ENCOUNTER — Ambulatory Visit: Payer: 59 | Admitting: Family Medicine

## 2021-08-25 VITALS — BP 100/60 | HR 77 | Temp 97.9°F | Ht 63.0 in | Wt 198.8 lb

## 2021-08-25 DIAGNOSIS — F3161 Bipolar disorder, current episode mixed, mild: Secondary | ICD-10-CM | POA: Diagnosis not present

## 2021-08-25 DIAGNOSIS — Z9889 Other specified postprocedural states: Secondary | ICD-10-CM

## 2021-08-25 DIAGNOSIS — E669 Obesity, unspecified: Secondary | ICD-10-CM

## 2021-08-25 NOTE — Progress Notes (Signed)
[facility] [address] [place] [address] Dept: [phone] Dept Fax: [phone]  Chronic Care Office Visit  Subjective:    Patient ID: Stephanie Spencer, female    DOB: 04-09-1961, 60 y.o..   MRN: [id number]  Chief Complaint  Patient presents with   Follow-up    F/u meds review. No main concerns.    History of Present Illness:  Patient is in today for reassessment of chronic medical issues.   Stephanie Spencer underwent a left knee arthroscopy on 06/29/2021 related to debridement of a medial meniscal tear and chondroplasty of the medial femoral condyle. She notes he knee is doing much better, though she does note a sensation of water on the knee creating some pressure. She was advised by the surgeon to take naproxen, but she has not started this.  Stephanie Spencer has a history of rheumatoid arthritis. She notes mild stiffness int he hands, but is not interested in pursuing other treatment.s   Stephanie Spencer has a history of bipolar disorder. She is currently managed on Abilify, Depakote, and citalopram. She wonders if she needs the citalopram, noting depression has not been a significant issue. She is interested in possibly reducing this, but also hesitant as she seems ot be stable on her current regimen.   Past Medical History: Patient Active Problem List   Diagnosis Date Noted   Obesity (BMI 35.0-39.9 without comorbidity) 08/25/2021   S/P left knee arthroscopy 08/25/2021   Genital herpes 02/24/2021   Recurrent oral herpes simplex 07/25/2014   Bipolar disorder (HCC) 02/24/2014   Rheumatoid arthritis (HCC) 02/24/2014   Past Surgical History:  Procedure Laterality Date   ABDOMINAL HYSTERECTOMY  2013   AUGMENTATION MAMMAPLASTY     BREAST ENHANCEMENT SURGERY  2005/1992   FOOT SURGERY     Left, Bersa   TONSILLECTOMY AND ADENOIDECTOMY  1972   Family History  Problem Relation Age of Onset   Mental illness Mother         Living   Dementia Mother    Parkinson's disease Father        Deceased   Alzheimer's disease Father    Cancer Maternal Grandmother    Diabetes Maternal Uncle    Arthritis Maternal Aunt        Psoriatic    Alcohol abuse Brother    Healthy Brother    Healthy Sister    Allergies Daughter    Healthy Daughter    Healthy Son        x2   Colon cancer Neg Hx    Outpatient Medications Prior to Visit  Medication Sig Dispense Refill   acyclovir (ZOVIRAX) 800 MG tablet Take 1 tablet (800 mg total) by mouth daily. 90 tablet 3   ARIPiprazole (ABILIFY) 5 MG tablet TAKE 1 TABLET(5 MG) BY MOUTH DAILY 90 tablet 0   Ascorbic Acid (VITAMIN C) 1000 MG tablet Take 1,000 mg by mouth daily.     Bacillus Coagulans-Inulin (PROBIOTIC FORMULA) 1-250 BILLION-MG CAPS Take by mouth daily.     citalopram (CELEXA) 20 MG tablet Take 1 tablet (20 mg total) by mouth daily. 90 tablet 1   Cranberry-Vitamin C 84-20 MG CAPS Take by mouth daily.     divalproex (DEPAKOTE) 250 MG DR tablet Take 1 tablet (250 mg total) by mouth 2 (two) times daily. 180 tablet 3   Multiple Vitamins-Minerals (CENTRUM ADULTS) TABS Take by mouth daily.     naproxen (NAPROSYN) 500 MG tablet Take 1 tablet (500 mg  total) by mouth 2 (two) times daily with a meal. 30 tablet 3   vitamin B-12 (CYANOCOBALAMIN) 500 MCG tablet Take 500 mcg by mouth daily.     HYDROcodone-acetaminophen (NORCO) 5-325 MG tablet Take 1 tablet by mouth 3 (three) times daily as needed. To be taken as needed for pain after surgery 20 tablet 0   ondansetron (ZOFRAN) 4 MG tablet Take 1 tablet (4 mg total) by mouth every 8 (eight) hours as needed for nausea or vomiting. 40 tablet 0   No facility-administered medications prior to visit.   Allergies  Allergen Reactions   Lithium Other (See Comments)    Boils   Penicillins Hives    Objective:   Today's Vitals   08/25/21 1557  BP: 100/60  Pulse: 77  Temp: 97.9 F (36.6 C)  TempSrc: Temporal  SpO2: 98%  Weight: 198 lb  12.8 oz (90.2 kg)  Height: 5\' 3"  (1.6 m)   Body mass index is 35.22 kg/m.   General: Well developed, well nourished. No acute distress. Extremities: Surgical wounds well-healed. No increased warmth over the joint. Psych: Alert and oriented. Normal mood and affect.  Health Maintenance Due  Topic Date Due   MAMMOGRAM  03/18/2021     Assessment & Plan:   1. Bipolar disorder, current episode mixed, mild (HCC) I recommend we do screening labs for metabolic complications of her use of Abilify. We did discuss her possibly tapering off of and stopping her citalopram. She remains unsure if she will do this.  - Lipid panel; Future - Hemoglobin A1c; Future - Comprehensive metabolic panel; Future  2. Obesity (BMI 35.0-39.9 without comorbidity) We discussed weight gain issues. She also has had some fatigue. I will check a TSH level to make sure that she is not developing hypothyroidism, as she also has some mild constipation and cold intolerance.  - TSH; Future  3. S/P left knee arthroscopy Much improved, though some minor post-operative symptoms. This will likely resolve with time.  09-28-1990, MD

## 2021-09-01 ENCOUNTER — Other Ambulatory Visit: Payer: 59

## 2021-09-06 LAB — HM MAMMOGRAPHY

## 2021-09-07 ENCOUNTER — Other Ambulatory Visit (INDEPENDENT_AMBULATORY_CARE_PROVIDER_SITE_OTHER): Payer: 59

## 2021-09-07 ENCOUNTER — Other Ambulatory Visit: Payer: Self-pay

## 2021-09-07 DIAGNOSIS — E669 Obesity, unspecified: Secondary | ICD-10-CM | POA: Diagnosis not present

## 2021-09-07 DIAGNOSIS — F3161 Bipolar disorder, current episode mixed, mild: Secondary | ICD-10-CM

## 2021-09-07 LAB — COMPREHENSIVE METABOLIC PANEL
ALT: 30 U/L (ref 0–35)
AST: 18 U/L (ref 0–37)
Albumin: 4.1 g/dL (ref 3.5–5.2)
Alkaline Phosphatase: 41 U/L (ref 39–117)
BUN: 22 mg/dL (ref 6–23)
CO2: 30 mEq/L (ref 19–32)
Calcium: 9.2 mg/dL (ref 8.4–10.5)
Chloride: 105 mEq/L (ref 96–112)
Creatinine, Ser: 0.72 mg/dL (ref 0.40–1.20)
GFR: 90.79 mL/min (ref >60.00)
Glucose, Bld: 91 mg/dL (ref 70–99)
Potassium: 4.3 mEq/L (ref 3.5–5.1)
Sodium: 142 mEq/L (ref 135–145)
Total Bilirubin: 0.3 mg/dL (ref 0.2–1.2)
Total Protein: 6.7 g/dL (ref 6.0–8.3)

## 2021-09-07 LAB — LIPID PANEL
Cholesterol: 183 mg/dL (ref 0–200)
HDL: 50.5 mg/dL (ref >39.00)
LDL Cholesterol: 109 mg/dL — ABNORMAL HIGH (ref 0–99)
NonHDL: 132.2
Total CHOL/HDL Ratio: 4
Triglycerides: 114 mg/dL (ref 0.0–149.0)
VLDL: 22.8 mg/dL (ref 0.0–40.0)

## 2021-09-07 LAB — HEMOGLOBIN A1C: Hgb A1c MFr Bld: 5.6 % (ref 4.6–6.5)

## 2021-09-07 LAB — TSH: TSH: 3.41 u[IU]/mL (ref 0.35–5.50)

## 2021-09-08 ENCOUNTER — Ambulatory Visit
Admission: RE | Admit: 2021-09-08 | Discharge: 2021-09-08 | Disposition: A | Discharge status: Home / Self Care | Payer: 59 | Source: Ambulatory Visit | Attending: Family Medicine | Admitting: Family Medicine

## 2021-09-08 DIAGNOSIS — Z1231 Encounter for screening mammogram for malignant neoplasm of breast: Secondary | ICD-10-CM

## 2021-09-12 ENCOUNTER — Other Ambulatory Visit: Payer: Self-pay | Admitting: Family Medicine

## 2021-09-12 DIAGNOSIS — R928 Other abnormal and inconclusive findings on diagnostic imaging of breast: Secondary | ICD-10-CM

## 2021-10-11 ENCOUNTER — Other Ambulatory Visit: Payer: Self-pay | Admitting: Orthopaedic Surgery

## 2021-11-01 ENCOUNTER — Other Ambulatory Visit: Payer: Self-pay

## 2021-11-01 ENCOUNTER — Ambulatory Visit
Admission: RE | Admit: 2021-11-01 | Discharge: 2021-11-01 | Disposition: A | Discharge status: Home / Self Care | Payer: 59 | Source: Ambulatory Visit | Attending: Family Medicine | Admitting: Family Medicine

## 2021-11-01 ENCOUNTER — Other Ambulatory Visit: Payer: Self-pay | Admitting: Family Medicine

## 2021-11-01 DIAGNOSIS — R928 Other abnormal and inconclusive findings on diagnostic imaging of breast: Secondary | ICD-10-CM

## 2021-11-19 ENCOUNTER — Other Ambulatory Visit: Payer: Self-pay | Admitting: Family Medicine

## 2021-11-19 DIAGNOSIS — F3161 Bipolar disorder, current episode mixed, mild: Secondary | ICD-10-CM

## 2021-12-05 ENCOUNTER — Other Ambulatory Visit: Payer: Self-pay

## 2021-12-05 ENCOUNTER — Ambulatory Visit (INDEPENDENT_AMBULATORY_CARE_PROVIDER_SITE_OTHER): Payer: Commercial Managed Care - PPO

## 2021-12-05 ENCOUNTER — Ambulatory Visit: Payer: Commercial Managed Care - PPO | Admitting: Family Medicine

## 2021-12-05 VITALS — BP 106/64 | HR 74 | Temp 97.3°F | Ht 63.0 in | Wt 201.4 lb

## 2021-12-05 DIAGNOSIS — M545 Low back pain, unspecified: Secondary | ICD-10-CM

## 2021-12-05 MED ORDER — MELOXICAM 7.5 MG PO TABS: 7.5000 mg | ORAL_TABLET | Freq: Every day | ORAL | 0 refills | Status: DC

## 2021-12-05 NOTE — Progress Notes (Signed)
[facility] [address] [place] [id number] Dept: [phone] Dept Fax: [phone]  Office Visit  Subjective:    Patient ID: Stephanie McmurrayKarlene Spencer, female    DOB: Jan 19, 1961, 61 y.o..   MRN: [id number]  Chief Complaint  Patient presents with   Acute Visit    C/o having low back pain x 1 week. She has been taking Ibuprofen with little relief.  She has been getting massages for about 6 weeks.     History of Present Illness:  Patient is in today with a 2-week history of low back pain. She has not had any specific injury. The pain is a constant ache with occasional shooting pains and pins and needles sensation. She is not having any sciatica. She denies any lower leg numbness or weakness. She has not had any recent unexplained weight loss, fever, or bowel/bladder incontinence. Ms. Stephanie Spencer has a history of RA, but her other joints are doing well. She did try several massages for this, but it has not helped.  Past Medical History: Patient Active Problem List   Diagnosis Date Noted   Obesity (BMI 35.0-39.9 without comorbidity) 08/25/2021   S/P left knee arthroscopy 08/25/2021   Genital herpes 02/24/2021   Recurrent oral herpes simplex 07/25/2014   Bipolar disorder (HCC) 02/24/2014   Rheumatoid arthritis (HCC) 02/24/2014   Past Surgical History:  Procedure Laterality Date   ABDOMINAL HYSTERECTOMY  2013   AUGMENTATION MAMMAPLASTY Bilateral 2005   BREAST ENHANCEMENT SURGERY  2005/1992   FOOT SURGERY     Left, Bersa   TONSILLECTOMY AND ADENOIDECTOMY  1972   Family History  Problem Relation Age of Onset   Mental illness Mother        Living   Dementia Mother    Parkinson's disease Father        Deceased   Alzheimer's disease Father    Cancer Maternal Grandmother    Diabetes Maternal Uncle    Arthritis Maternal Aunt        Psoriatic    Alcohol abuse Brother    Healthy Brother    Healthy Sister    Allergies Daughter     Healthy Daughter    Healthy Son        x2   Colon cancer Neg Hx    Outpatient Medications Prior to Visit  Medication Sig Dispense Refill   acyclovir (ZOVIRAX) 800 MG tablet Take 1 tablet (800 mg total) by mouth daily. 90 tablet 3   ARIPiprazole (ABILIFY) 5 MG tablet TAKE 1 TABLET(5 MG) BY MOUTH DAILY 90 tablet 3   Ascorbic Acid (VITAMIN C) 1000 MG tablet Take 1,000 mg by mouth daily.     Bacillus Coagulans-Inulin (PROBIOTIC FORMULA) 1-250 BILLION-MG CAPS Take by mouth daily.     citalopram (CELEXA) 20 MG tablet Take 1 tablet (20 mg total) by mouth daily. 90 tablet 1   Cranberry-Vitamin C 84-20 MG CAPS Take by mouth daily.     divalproex (DEPAKOTE) 250 MG DR tablet Take 1 tablet (250 mg total) by mouth 2 (two) times daily. 180 tablet 3   Multiple Vitamins-Minerals (CENTRUM ADULTS) TABS Take by mouth daily.     vitamin B-12 (CYANOCOBALAMIN) 500 MCG tablet Take 500 mcg by mouth daily.     naproxen (NAPROSYN) 500 MG tablet TAKE 1 TABLET(500 MG) BY MOUTH TWICE DAILY WITH A MEAL (Patient not taking: Reported on 12/05/2021) 30 tablet 3   No facility-administered medications prior to visit.   Allergies  Allergen Reactions  Lithium Other (See Comments)    Boils   Penicillins Hives    Objective:   Today's Vitals   12/05/21 1344  BP: 106/64  Pulse: 74  Temp: (!) 97.3 F (36.3 C)  TempSrc: Temporal  SpO2: 98%  Weight: 201 lb 6.4 oz (91.4 kg)  Height: 5\' 3"  (1.6 m)   Body mass index is 35.68 kg/m.   General: Well developed, well nourished. No acute distress. Back: Straight. Pain more towards the lumbosacral/sacral area. No swelling or tenderness with palpation. Extremities: Full ROM. Strength 5/5. Skin: Warm and dry. No rashes. Neuro: Normal sensation of LE and DTR 2+ bilaterally. Psych: Alert and oriented x3. Normal mood and affect.  Health Maintenance Due  Topic Date Due   COVID-19 Vaccine (4 - Booster for Pfizer series) 12/02/2020   Imaging: Lumbar spine: Mild arthritic  changes. Alignment looks good. No spondylolisthesis.   SI joints appear normal.  Assessment & Plan:   1. Acute bilateral low back pain without sciatica Recommend heat, stretches, and an antiinflammatory. She prefers to avoid steroids if possible. if not improving in 2 weeks, would recommend PT referral.  - DG Lumbar Spine Complete - meloxicam (MOBIC) 7.5 MG tablet; Take 1 tablet (7.5 mg total) by mouth daily.  Dispense: 14 tablet; Refill: 0  Loyola Mast, MD

## 2021-12-11 ENCOUNTER — Encounter: Payer: Self-pay | Admitting: Family Medicine

## 2021-12-11 NOTE — Telephone Encounter (Signed)
Called patient and she states that rash started this morning.  She alos had a spot removed on her LT cheek last week.  She states the Meloxicam is working great for her back. Advised that it would be best to come in to be evaluated, scheduled her an appointment for 12/13/21 @8 :00 am.  Patient agreeable. Dm/cma

## 2021-12-12 ENCOUNTER — Other Ambulatory Visit: Payer: Self-pay

## 2021-12-13 ENCOUNTER — Ambulatory Visit: Payer: Commercial Managed Care - PPO | Admitting: Family Medicine

## 2021-12-13 ENCOUNTER — Encounter: Payer: Self-pay | Admitting: Family Medicine

## 2021-12-13 VITALS — BP 118/72 | HR 82 | Temp 97.4°F | Ht 63.0 in | Wt 202.6 lb

## 2021-12-13 DIAGNOSIS — S01402A Unspecified open wound of left cheek and temporomandibular area, initial encounter: Secondary | ICD-10-CM

## 2021-12-13 DIAGNOSIS — T148XXA Other injury of unspecified body region, initial encounter: Secondary | ICD-10-CM

## 2021-12-13 DIAGNOSIS — L308 Other specified dermatitis: Secondary | ICD-10-CM

## 2021-12-13 MED ORDER — BETAMETHASONE DIPROPIONATE 0.05 % EX CREA: TOPICAL_CREAM | Freq: Two times a day (BID) | CUTANEOUS | 0 refills | Status: DC

## 2021-12-13 NOTE — Progress Notes (Signed)
[facility] [address] [place] [address] Dept: [phone] Dept Fax: [phone]  Office Visit  Subjective:    Patient ID: Stephanie Spencer, female    DOB: Dec 29, 1960, 61 y.o..   MRN: [id number]  Chief Complaint  Patient presents with   Acute Visit    C/o having rash on neck/face x 3-4 days.   No OTC meds.      History of Present Illness:  Patient is in today for evaluation of a 3-4 day history of a rash on her neck and cheek. She ntoes the rash is pruritic. She denies any new hair products, soaps, wearing any necklaces, or other exposures that she can think of. She did start some meloxicam after a recent procedure. She has not had similar int he past.  Stephanie Spencer was seen last Friday by dermatology for removal of a cyst on her left cheek. She is levaing for a beach trip in 2 days and her dermatologist could not get her in sooner for suture removal.  Past Medical History: Patient Active Problem List   Diagnosis Date Noted   Obesity (BMI 35.0-39.9 without comorbidity) 08/25/2021   S/P left knee arthroscopy 08/25/2021   Genital herpes 02/24/2021   Recurrent oral herpes simplex 07/25/2014   Bipolar disorder (HCC) 02/24/2014   Rheumatoid arthritis (HCC) 02/24/2014   Past Surgical History:  Procedure Laterality Date   ABDOMINAL HYSTERECTOMY  2013   AUGMENTATION MAMMAPLASTY Bilateral 2005   BREAST ENHANCEMENT SURGERY  2005/1992   FOOT SURGERY     Left, Bersa   KNEE ARTHROSCOPY Left    TONSILLECTOMY AND ADENOIDECTOMY  1972   Family History  Problem Relation Age of Onset   Mental illness Mother        Living   Dementia Mother    Parkinson's disease Father        Deceased   Alzheimer's disease Father    Cancer Maternal Grandmother    Diabetes Maternal Uncle    Arthritis Maternal Aunt        Psoriatic    Alcohol abuse Brother    Healthy Brother    Healthy Sister    Allergies Daughter    Healthy Daughter     Healthy Son        x2   Colon cancer Neg Hx    Outpatient Medications Prior to Visit  Medication Sig Dispense Refill   acyclovir (ZOVIRAX) 800 MG tablet Take 1 tablet (800 mg total) by mouth daily. 90 tablet 3   ARIPiprazole (ABILIFY) 5 MG tablet TAKE 1 TABLET(5 MG) BY MOUTH DAILY 90 tablet 3   Ascorbic Acid (VITAMIN C) 1000 MG tablet Take 1,000 mg by mouth daily.     Bacillus Coagulans-Inulin (PROBIOTIC FORMULA) 1-250 BILLION-MG CAPS Take by mouth daily.     citalopram (CELEXA) 20 MG tablet Take 1 tablet (20 mg total) by mouth daily. 90 tablet 1   Cranberry-Vitamin C 84-20 MG CAPS Take by mouth daily.     divalproex (DEPAKOTE) 250 MG DR tablet Take 1 tablet (250 mg total) by mouth 2 (two) times daily. 180 tablet 3   meloxicam (MOBIC) 7.5 MG tablet Take 1 tablet (7.5 mg total) by mouth daily. 14 tablet 0   Multiple Vitamins-Minerals (CENTRUM ADULTS) TABS Take by mouth daily.     naproxen (NAPROSYN) 500 MG tablet TAKE 1 TABLET(500 MG) BY MOUTH TWICE DAILY WITH A MEAL 30 tablet 3   vitamin B-12 (CYANOCOBALAMIN) 500 MCG tablet Take 500  mcg by mouth daily.     No facility-administered medications prior to visit.   Allergies  Allergen Reactions   Lithium Other (See Comments)    Boils   Penicillins Hives   Objective:   Today's Vitals   12/13/21 0804  BP: 118/72  Pulse: 82  Temp: (!) 97.4 F (36.3 C)  TempSrc: Temporal  SpO2: 98%  Weight: 202 lb 9.6 oz (91.9 kg)  Height: 5\' 3"  (1.6 m)   Body mass index is 35.89 kg/m.   General: Well developed, well nourished. No acute distress. Skin: There are multiple, coalescing patches of maculopapular, erythematous, blanchable rash across the   nape of the neck. There is a 1 cm patch on the right cheek that appears similar. There is a 1 cm healing  wound on the left cheek with 5 interrupted sutures. The wound appears to be healing well. Psych: Alert and oriented x3. Normal mood and affect.  Health Maintenance Due  Topic Date Due    COVID-19 Vaccine (4 - Booster for Pfizer series) 12/02/2020   PROCEDURE- Suture removal Indication: S/P cyst removal Anesthesia: Local, None  PARQ reviewed with patient. Verbal consent obtained. Site cleaned with alcohol. Sutures removed with forceps and suture scissors. Patient tolerated procedure well. Routine wound care instructions reviewed.    Assessment & Plan:   1. Other eczema The rash appears to be eczematous, possibly due to an allergic response. I will have her use a mid-potency steroid cream on this. I recommended she use OTC hydrocortisone cream on the spot on the cheek. Return in 1 week if not improving.  - betamethasone dipropionate 0.05 % cream; Apply topically 2 (two) times daily.  Dispense: 30 g; Refill: 0  2. Suture of skin wound Removed sutures as noted above.  Loyola MastStephen M Rachal Dvorsky, MD

## 2021-12-13 NOTE — Patient Instructions (Signed)
Keep suture site covered for 1-2 days.

## 2021-12-16 ENCOUNTER — Other Ambulatory Visit: Payer: Self-pay | Admitting: Family Medicine

## 2021-12-16 DIAGNOSIS — M545 Low back pain, unspecified: Secondary | ICD-10-CM

## 2022-01-09 ENCOUNTER — Other Ambulatory Visit: Payer: Self-pay | Admitting: Family Medicine

## 2022-01-09 DIAGNOSIS — F3161 Bipolar disorder, current episode mixed, mild: Secondary | ICD-10-CM

## 2022-02-26 ENCOUNTER — Other Ambulatory Visit: Payer: Self-pay | Admitting: Family Medicine

## 2022-02-26 DIAGNOSIS — F3161 Bipolar disorder, current episode mixed, mild: Secondary | ICD-10-CM

## 2022-03-26 ENCOUNTER — Other Ambulatory Visit: Payer: Self-pay | Admitting: Family Medicine

## 2022-03-26 DIAGNOSIS — B002 Herpesviral gingivostomatitis and pharyngotonsillitis: Secondary | ICD-10-CM

## 2022-04-03 ENCOUNTER — Ambulatory Visit (INDEPENDENT_AMBULATORY_CARE_PROVIDER_SITE_OTHER): Payer: BC Managed Care – PPO

## 2022-04-03 ENCOUNTER — Ambulatory Visit: Payer: BC Managed Care – PPO | Admitting: Orthopaedic Surgery

## 2022-04-03 VITALS — Ht 64.0 in | Wt 200.0 lb

## 2022-04-03 DIAGNOSIS — G8929 Other chronic pain: Secondary | ICD-10-CM

## 2022-04-03 DIAGNOSIS — M25562 Pain in left knee: Secondary | ICD-10-CM | POA: Diagnosis not present

## 2022-04-03 NOTE — Progress Notes (Signed)
Office Visit Note   Patient: Stephanie Spencer           Date of Birth: 1961-10-19           MRN: [id number] Visit Date: 04/03/2022              Requested by: Haydee Salter, MD [place],  [place] [address] PCP: Haydee Salter, MD   Assessment & Plan: Visit Diagnoses:  1. Chronic pain of left knee     Plan: Impression is recurrent to left knee pain concerning for medial meniscus tear.  I reviewed the previous MRI and arthroscopic photos and concerned that she may have torn another portion of the medial meniscus or possibly she may have developed stress fracture.  We will need another MRI to rule out structural abnormalities.  I will contact the patient with the results of the MRI.  Follow-Up Instructions: No follow-ups on file.   Orders:  Orders Placed This Encounter  Procedures   XR KNEE 3 VIEW LEFT   No orders of the defined types were placed in this encounter.     Procedures: No procedures performed   Clinical Data: No additional findings.   Subjective: Chief Complaint  Patient presents with   Left Knee - Pain    HPI Ms. Crask comes in today for recurrent left knee pain for about 6 weeks.  Denies any injuries.  Feels pain on the medial side of the knee that is reminiscent of previous symptoms.  Underwent left knee scope in September 2022 with medial meniscal debridement.  She feels symptoms of locking and tightening.  She is having trouble walking due to giving way and locking.  Review of Systems   Objective: Vital Signs: Ht 5\' 4"  (1.626 m)   Wt 200 lb (90.7 kg)   BMI 34.33 kg/m   Physical Exam  Ortho Exam Examination left knee shows a trace effusion.  Exquisite medial joint tenderness.  Pain at the medial joint line with McMurray testing.  Range of motion is preserved.  Collaterals and cruciates are stable. Specialty Comments:  No specialty comments available.  Imaging: XR KNEE 3 VIEW LEFT  Result Date: 04/03/2022 No  acute or structural abnormalities    PMFS History: Patient Active Problem List   Diagnosis Date Noted   Obesity (BMI 35.0-39.9 without comorbidity) 08/25/2021   S/P left knee arthroscopy 08/25/2021   Genital herpes 02/24/2021   Recurrent oral herpes simplex 07/25/2014   Bipolar disorder ([place]) 02/24/2014   Rheumatoid arthritis ([place]) 02/24/2014   Past Medical History:  Diagnosis Date   Chicken pox    Chronic rheumatic arthritis (HCC)    GERD (gastroesophageal reflux disease)    Hemorrhoids    Hyperlipidemia    Borderline   Mumps    RLS (restless legs syndrome)    Urinary incontinence    Leakage   UTI (lower urinary tract infection)     Family History  Problem Relation Age of Onset   Mental illness Mother        Living   Dementia Mother    Parkinson's disease Father        Deceased   Alzheimer's disease Father    Cancer Maternal Grandmother    Diabetes Maternal Uncle    Arthritis Maternal Aunt        Psoriatic    Alcohol abuse Brother    Healthy Brother    Healthy Sister    Allergies Daughter    Healthy Daughter  Healthy Son        x2   Colon cancer Neg Hx     Past Surgical History:  Procedure Laterality Date   ABDOMINAL HYSTERECTOMY  2013   AUGMENTATION MAMMAPLASTY Bilateral 2005   BREAST ENHANCEMENT SURGERY  2005/1992   FOOT SURGERY     Left, Bersa   KNEE ARTHROSCOPY Left    TONSILLECTOMY AND ADENOIDECTOMY  1972   Social History   Occupational History   Occupation: Sales promotion account executive    Comment: [employer]  Tobacco Use   Smoking status: Former    Types: Cigarettes    Quit date: 06/30/2015    Years since quitting: 6.7   Smokeless tobacco: Never  Vaping Use   Vaping Use: Some days   Substances: THC, CBD  Substance and Sexual Activity   Alcohol use: Yes    Alcohol/week: 0.0 standard drinks    Comment: very rare   Drug use: Yes    Types: Marijuana    Comment: 3-4 times per week   Sexual activity: Yes    Birth control/protection:  Surgical

## 2022-04-05 ENCOUNTER — Other Ambulatory Visit: Payer: Self-pay

## 2022-04-05 ENCOUNTER — Encounter: Payer: Self-pay | Admitting: Orthopaedic Surgery

## 2022-04-05 DIAGNOSIS — M25562 Pain in left knee: Secondary | ICD-10-CM

## 2022-04-05 NOTE — Telephone Encounter (Signed)
Will be work on it today, it is not stat

## 2022-04-05 NOTE — Telephone Encounter (Signed)
Order was missed. Can you get this done quicker due to this?

## 2022-04-09 ENCOUNTER — Encounter: Payer: Self-pay | Admitting: Orthopaedic Surgery

## 2022-04-16 ENCOUNTER — Ambulatory Visit
Admission: RE | Admit: 2022-04-16 | Discharge: 2022-04-16 | Disposition: A | Discharge status: Home / Self Care | Payer: BC Managed Care – PPO | Source: Ambulatory Visit | Attending: Orthopaedic Surgery | Admitting: Orthopaedic Surgery

## 2022-04-16 DIAGNOSIS — G8929 Other chronic pain: Secondary | ICD-10-CM

## 2022-04-17 ENCOUNTER — Encounter: Payer: Self-pay | Admitting: Orthopaedic Surgery

## 2022-04-19 ENCOUNTER — Telehealth: Payer: Self-pay | Admitting: Orthopaedic Surgery

## 2022-04-19 NOTE — Telephone Encounter (Signed)
Pt called to reschedule an appt. Pt and Dr Roda Shutters talked thru [facility] and Dr Roda Shutters told pt to come Friday morning for injection. Please open morning slot and call pt with time. Pt phone number is [phone].

## 2022-04-19 NOTE — Telephone Encounter (Signed)
Called and scheduled for tomorrow morning.  

## 2022-04-20 ENCOUNTER — Encounter: Payer: Self-pay | Admitting: Orthopaedic Surgery

## 2022-04-20 ENCOUNTER — Ambulatory Visit: Payer: BC Managed Care – PPO | Admitting: Orthopaedic Surgery

## 2022-04-20 DIAGNOSIS — S83242A Other tear of medial meniscus, current injury, left knee, initial encounter: Secondary | ICD-10-CM

## 2022-04-20 MED ORDER — METHYLPREDNISOLONE ACETATE 40 MG/ML IJ SUSP
40.0000 mg | INTRAMUSCULAR | Status: AC | PRN
  Administered 2022-04-20: 40 mg via INTRA_ARTICULAR

## 2022-04-20 MED ORDER — BUPIVACAINE HCL 0.5 % IJ SOLN
2.0000 mL | INTRAMUSCULAR | Status: AC | PRN
  Administered 2022-04-20: 2 mL via INTRA_ARTICULAR

## 2022-04-20 MED ORDER — LIDOCAINE HCL 1 % IJ SOLN
2.0000 mL | INTRAMUSCULAR | Status: AC | PRN
  Administered 2022-04-20: 2 mL

## 2022-05-30 ENCOUNTER — Ambulatory Visit: Payer: BC Managed Care – PPO | Admitting: Family Medicine

## 2022-05-30 ENCOUNTER — Encounter: Payer: Self-pay | Admitting: Family Medicine

## 2022-05-30 ENCOUNTER — Encounter: Payer: Self-pay | Admitting: Orthopaedic Surgery

## 2022-05-30 VITALS — BP 120/70 | HR 80 | Temp 97.4°F | Ht 64.0 in | Wt 197.6 lb

## 2022-05-30 DIAGNOSIS — J069 Acute upper respiratory infection, unspecified: Secondary | ICD-10-CM | POA: Diagnosis not present

## 2022-05-30 LAB — POC COVID19 BINAXNOW: SARS Coronavirus 2 Ag: NEGATIVE

## 2022-05-30 NOTE — Progress Notes (Signed)
[facility] [address] [place] [address] Dept: [phone] Dept Fax: [phone]  Office Visit  Subjective:    Patient ID: Stephanie Spencer, female    DOB: 06/04/1961, 61 y.o..   MRN: [id number]  Chief Complaint  Patient presents with   Acute Visit    C/o having ST/swollen glands, nasal congestion  x 1 week.  Has been using Cepacol drops with little relief.     History of Present Illness:  Patient is in today with a 1-week history of nasal congestion, sore throat, swollen glands in the right neck, and fatigue. She had been in [place] for the past 2 weeks, caring for her mother (who is on Hospice). She notes the sore throat was quite severe yesterday, thoguh some better today. She admits to some chills, but is not sure if she has had fever. She did a home COVID test yesterday, which was negative.  Past Medical History: Patient Active Problem List   Diagnosis Date Noted   Obesity (BMI 35.0-39.9 without comorbidity) 08/25/2021   S/P left knee arthroscopy 08/25/2021   Genital herpes 02/24/2021   Recurrent oral herpes simplex 07/25/2014   Bipolar disorder (HCC) 02/24/2014   Rheumatoid arthritis (HCC) 02/24/2014   Past Surgical History:  Procedure Laterality Date   ABDOMINAL HYSTERECTOMY  2013   AUGMENTATION MAMMAPLASTY Bilateral 2005   BREAST ENHANCEMENT SURGERY  2005/1992   FOOT SURGERY     Left, Bersa   KNEE ARTHROSCOPY Left    TONSILLECTOMY AND ADENOIDECTOMY  1972   Family History  Problem Relation Age of Onset   Mental illness Mother        Living   Dementia Mother    Parkinson's disease Father        Deceased   Alzheimer's disease Father    Cancer Maternal Grandmother    Diabetes Maternal Uncle    Arthritis Maternal Aunt        Psoriatic    Alcohol abuse Brother    Healthy Brother    Healthy Sister    Allergies Daughter    Healthy Daughter    Healthy Son        x2   Colon cancer Neg Hx     Outpatient Medications Prior to Visit  Medication Sig Dispense Refill   acyclovir (ZOVIRAX) 800 MG tablet TAKE 1 TABLET(800 MG) BY MOUTH DAILY 90 tablet 3   ARIPiprazole (ABILIFY) 5 MG tablet TAKE 1 TABLET(5 MG) BY MOUTH DAILY 90 tablet 3   Ascorbic Acid (VITAMIN C) 1000 MG tablet Take 1,000 mg by mouth daily.     Bacillus Coagulans-Inulin (PROBIOTIC FORMULA) 1-250 BILLION-MG CAPS Take by mouth daily.     betamethasone dipropionate 0.05 % cream Apply topically 2 (two) times daily. 30 g 0   citalopram (CELEXA) 20 MG tablet TAKE 1 TABLET(20 MG) BY MOUTH DAILY 90 tablet 1   Cranberry-Vitamin C 84-20 MG CAPS Take by mouth daily.     divalproex (DEPAKOTE) 250 MG DR tablet TAKE 1 TABLET(250 MG) BY MOUTH TWICE DAILY 180 tablet 3   meloxicam (MOBIC) 7.5 MG tablet Take 1 tablet (7.5 mg total) by mouth daily. 14 tablet 0   Multiple Vitamins-Minerals (CENTRUM ADULTS) TABS Take by mouth daily.     naproxen (NAPROSYN) 500 MG tablet TAKE 1 TABLET(500 MG) BY MOUTH TWICE DAILY WITH A MEAL 30 tablet 3   vitamin B-12 (CYANOCOBALAMIN) 500 MCG tablet Take 500 mcg by mouth daily.     No facility-administered medications  prior to visit.   Allergies  Allergen Reactions   Lithium Other (See Comments)    Boils   Penicillins Hives     Objective:   Today's Vitals   05/30/22 1038  BP: 120/70  Pulse: 80  Temp: (!) 97.4 F (36.3 C)  TempSrc: Temporal  SpO2: 97%  Weight: 197 lb 9.6 oz (89.6 kg)  Height: 5\' 4"  (1.626 m)   Body mass index is 33.92 kg/m.   General: Well developed, well nourished. No acute distress. HEENT: Normocephalic, non-traumatic. Conjunctiva clear. External ears normal. Right   EAC obscured with wax. Left EAC and TM normal. Nose clear without congestion or   rhinorrhea. Mucous membranes moist. Moderate mucous streaking of the posterior   oropharynx. Good dentition. Neck: Supple. No lymphadenopathy, but mild prominence of the glands in the right neck. Lungs: Clear to auscultation  bilaterally. No wheezing, rales or rhonchi. CV: RRR without murmurs or rubs. Pulses 2+ bilaterally. Psych: Alert and oriented. Normal mood and affect.  Health Maintenance Due  Topic Date Due   COVID-19 Vaccine (4 - Pfizer series) 12/02/2020   Lab Results POCT COVID- Neg.    Assessment & Plan:   1. Viral upper respiratory tract infection  Discussed home care for viral illness, including rest, pushing fluids, and OTC medications as needed for symptom relief. Recommend hot tea with honey for sore throat symptoms. Follow-up if needed for worsening or persistent symptoms.  - POC COVID-19  Return if symptoms worsen or fail to improve.   01/30/2021, MD

## 2022-06-06 ENCOUNTER — Encounter (INDEPENDENT_AMBULATORY_CARE_PROVIDER_SITE_OTHER): Payer: Self-pay

## 2022-07-06 ENCOUNTER — Other Ambulatory Visit: Payer: Self-pay | Admitting: Family Medicine

## 2022-07-06 DIAGNOSIS — F3161 Bipolar disorder, current episode mixed, mild: Secondary | ICD-10-CM

## 2022-11-30 ENCOUNTER — Other Ambulatory Visit: Payer: Self-pay | Admitting: Family Medicine

## 2022-11-30 DIAGNOSIS — F3161 Bipolar disorder, current episode mixed, mild: Secondary | ICD-10-CM

## 2023-01-10 ENCOUNTER — Other Ambulatory Visit: Payer: Self-pay | Admitting: Family Medicine

## 2023-01-10 DIAGNOSIS — Z1231 Encounter for screening mammogram for malignant neoplasm of breast: Secondary | ICD-10-CM

## 2023-02-05 ENCOUNTER — Telehealth: Payer: Self-pay | Admitting: Family Medicine

## 2023-02-05 NOTE — Telephone Encounter (Signed)
Spoke to patient, she states that the pain in LT rib cage has been off/on x 4-5 days.  No OTC meds taken and can be relieved by changing positions.    Advised her to try taking tums and if for some reason the symptoms get worse before her appointment tomorrow to go to the ED to be evaluated.  Patient agrees with plan. Dm/cma

## 2023-02-05 NOTE — Telephone Encounter (Signed)
Caller Name: Cheznie Call back phone #: [phone]  Reason for Call: Please call pt concerning her symptoms of pain under left rib. She was sure it was not her heart and wasn't hard to breathe but should be called.

## 2023-02-06 ENCOUNTER — Encounter: Payer: Self-pay | Admitting: Family Medicine

## 2023-02-06 ENCOUNTER — Ambulatory Visit: Payer: BC Managed Care – PPO | Admitting: Family Medicine

## 2023-02-06 VITALS — BP 110/66 | HR 77 | Temp 98.5°F | Ht 64.0 in | Wt 185.2 lb

## 2023-02-06 DIAGNOSIS — M2391 Unspecified internal derangement of right knee: Secondary | ICD-10-CM | POA: Diagnosis not present

## 2023-02-06 DIAGNOSIS — R1012 Left upper quadrant pain: Secondary | ICD-10-CM

## 2023-02-06 DIAGNOSIS — R7301 Impaired fasting glucose: Secondary | ICD-10-CM | POA: Insufficient documentation

## 2023-02-06 NOTE — Progress Notes (Signed)
[facility] [address] [place] [address] Dept: [phone] Dept Fax: [phone]  Office Visit  Subjective:    Patient ID: Stephanie Spencer, female    DOB: Jun 13, 1961, 62 y.o..   MRN: [id number]  Chief Complaint  Patient presents with   Knee Pain    C/o having RT knee pain x 2 weeks.  Pain under LT breast that is sore x 4-5 days.   No OTC meds.    History of Present Illness:  Patient is in today for complaining of right knee pain for the past 2 weeks. She has had a prior history of left knee cartilage issues. She had a left knee arthroscopy in 2022. She notes the knee is painful medially. She has not noted any popping or clicking. she denies any acute inciting event.  Ms. Vilorio also notes a 4-5 day history of some pain in the LUQ of her abdomen. She notes this is fairly persistent. She had discussed this with the NP with her weight loss clinic. She was told this might be gas secondary to her use of semaglutide. She has had some mild constipation, but had otherwise been managing this well.  Past Medical History: Patient Active Problem List   Diagnosis Date Noted   Obesity (BMI 35.0-39.9 without comorbidity) 08/25/2021   S/P left knee arthroscopy 08/25/2021   Genital herpes 02/24/2021   Recurrent oral herpes simplex 07/25/2014   Bipolar disorder 02/24/2014   Rheumatoid arthritis 02/24/2014   Past Surgical History:  Procedure Laterality Date   ABDOMINAL HYSTERECTOMY  2013   AUGMENTATION MAMMAPLASTY Bilateral 2005   BREAST ENHANCEMENT SURGERY  2005/1992   FOOT SURGERY     Left, Bers[place]   KNEE ARTHROSCOPY Left    TONSILLECTOMY AND ADENOIDECTOMY  1972   Family History  Problem Relation Age of Onset   Mental illness Mother        Living   Dementia Mother    Parkinson's disease Father        Deceased   Alzheimer's disease Father    Cancer Maternal Grandmother    Diabetes Maternal Uncle    Arthritis Maternal  Aunt        Psoriatic    Alcohol abuse Brother    Healthy Brother    Healthy Sister    Allergies Daughter    Healthy Daughter    Healthy Son        x2   Colon cancer Neg Hx    Outpatient Medications Prior to Visit  Medication Sig Dispense Refill   acyclovir (ZOVIRAX) 800 MG tablet TAKE 1 TABLET(800 MG) BY MOUTH DAILY 90 tablet 3   ARIPiprazole (ABILIFY) 5 MG tablet TAKE 1 TABLET(5 MG) BY MOUTH DAILY 90 tablet 3   Ascorbic Acid (VITAMIN C) 1000 MG tablet Take 1,000 mg by mouth daily.     Bacillus Coagulans-Inulin (PROBIOTIC FORMULA) 1-250 BILLION-MG CAPS Take by mouth daily.     citalopram (CELEXA) 20 MG tablet TAKE 1 TABLET(20 MG) BY MOUTH DAILY 90 tablet 3   Cranberry-Vitamin C 84-20 MG CAPS Take by mouth daily.     divalproex (DEPAKOTE) 250 MG DR tablet TAKE 1 TABLET(250 MG) BY MOUTH TWICE DAILY 180 tablet 3   Insulin Pen Needle (PEN NEEDLES) 32G X 4 MM MISC Use to inject Ozempic as directed. for 30 days     Multiple Vitamins-Minerals (CENTRUM ADULTS) TABS Take by mouth daily.     OZEMPIC, 1 MG/DOSE, 4 MG/3ML SOPN Inject 1 mg  into the muscle once a week.     vitamin B-12 (CYANOCOBALAMIN) 500 MCG tablet Take 500 mcg by mouth daily.     meloxicam (MOBIC) 7.5 MG tablet Take 1 tablet (7.5 mg total) by mouth daily. (Patient not taking: Reported on 02/06/2023) 14 tablet 0   betamethasone dipropionate 0.05 % cream Apply topically 2 (two) times daily. 30 g 0   naproxen (NAPROSYN) 500 MG tablet TAKE 1 TABLET(500 MG) BY MOUTH TWICE DAILY WITH A MEAL 30 tablet 3   No facility-administered medications prior to visit.   Allergies  Allergen Reactions   Lithium Other (See Comments)    Boils   Penicillins Hives     Objective:   Today's Vitals   02/06/23 1444  BP: 110/66  Pulse: 77  Temp: 98.5 F (36.9 C)  TempSrc: Temporal  SpO2: 98%  Weight: 185 lb 3.2 oz (84 kg)  Height: 5\' 4"  (1.626 m)   Body mass index is 31.79 kg/m.   General: Well developed, well nourished. No acute  distress. Abdomen: Soft. Bowel sounds positive, normal pitch and frequency. No hepatosplenomegaly. Mild tnderness in   the LUQ, just under the lateral rib cage. No rebound or guarding. Extremities: Full ROM. No joint swelling or tenderness. Varus/valgus testing normal, though mild medial pain   with varus testing. Lachman's negative. Spencer's maneuver produces palpable and audible popping   medially. No edema noted. Psych: Alert and oriented. Normal mood and affect.  Health Maintenance Due  Topic Date Due   COVID-19 Vaccine (4 - 2023-24 season) 06/29/2022   MAMMOGRAM  11/01/2022     Assessment & Plan:   Problem List Items Addressed This Visit   None Visit Diagnoses     Internal derangement of right knee    -  Primary   I suspect there is a cartialge tear tot he medial meniscus. I recommend Ms. Walstrom return to see Dr. Roda Shutters regarding this. She could try naproxen bid initially.   Acute LUQ pain       This may be due to constipation/gas. She shoudl try soem Gas-x to see if this will relieve. I don't see an issue that woudl require imaging at this point.       Return if symptoms worsen or fail to improve.   Loyola Mast, MD

## 2023-02-17 NOTE — Progress Notes (Unsigned)
Office Visit Note   Patient: Stephanie Spencer           Date of Birth: 21-Oct-1961           MRN: [id number] Visit Date: 02/19/2023              Requested by: Loyola Mast, MD [address] [place],  [place] [address] PCP: Loyola Mast, MD   Assessment & Plan: Visit Diagnoses: No diagnosis found.  Plan: ***  Follow-Up Instructions: No follow-ups on file.   Orders:  No orders of the defined types were placed in this encounter.  No orders of the defined types were placed in this encounter.     Procedures: No procedures performed   Clinical Data: No additional findings.   Subjective: No chief complaint on file.   HPI  Review of Systems  Constitutional: Negative.   HENT: Negative.    Eyes: Negative.   Respiratory: Negative.    Cardiovascular: Negative.   Endocrine: Negative.   Musculoskeletal: Negative.   Neurological: Negative.   Hematological: Negative.   Psychiatric/Behavioral: Negative.    All other systems reviewed and are negative.   Objective: Vital Signs: There were no vitals taken for this visit.  Physical Exam Vitals and nursing note reviewed.  Constitutional:      Appearance: She is well-developed.  HENT:     Head: Normocephalic and atraumatic.  Pulmonary:     Effort: Pulmonary effort is normal.  Abdominal:     Palpations: Abdomen is soft.  Musculoskeletal:     Cervical back: Neck supple.  Skin:    General: Skin is warm.     Capillary Refill: Capillary refill takes less than 2 seconds.  Neurological:     Mental Status: She is alert and oriented to person, place, and time.  Psychiatric:        Behavior: Behavior normal.        Thought Content: Thought content normal.        Judgment: Judgment normal.   Ortho Exam  Specialty Comments:  No specialty comments available.  Imaging: No results found.   PMFS History: Patient Active Problem List   Diagnosis Date Noted  . Impaired fasting glucose 02/06/2023  .  Obesity (BMI 35.0-39.9 without comorbidity) 08/25/2021  . S/P left knee arthroscopy 08/25/2021  . Genital herpes 02/24/2021  . Recurrent oral herpes simplex 07/25/2014  . Bipolar disorder 02/24/2014  . Rheumatoid arthritis 02/24/2014   Past Medical History:  Diagnosis Date  . Chicken pox   . Chronic rheumatic arthritis   . GERD (gastroesophageal reflux disease)   . Hemorrhoids   . Hyperlipidemia    Borderline  . Mumps   . RLS (restless legs syndrome)   . Urinary incontinence    Leakage  . UTI (lower urinary tract infection)     Family History  Problem Relation Age of Onset  . Mental illness Mother        Living  . Dementia Mother   . Parkinson's disease Father        Deceased  . Alzheimer's disease Father   . Cancer Maternal Grandmother   . Diabetes Maternal Uncle   . Arthritis Maternal Aunt        Psoriatic   . Alcohol abuse Brother   . Healthy Brother   . Healthy Sister   . Allergies Daughter   . Healthy Daughter   . Healthy Son        x2  . Colon cancer Neg  Hx     Past Surgical History:  Procedure Laterality Date  . ABDOMINAL HYSTERECTOMY  2013  . AUGMENTATION MAMMAPLASTY Bilateral 2005  . BREAST ENHANCEMENT SURGERY  2005/1992  . FOOT SURGERY     Left, Bersa  . KNEE ARTHROSCOPY Left   . TONSILLECTOMY AND ADENOIDECTOMY  1972   Social History   Occupational History  . Occupation: Film/video editor    Comment: Engineer, manufacturing systems  Tobacco Use  . Smoking status: Former    Types: Cigarettes    Quit date: 06/30/2015    Years since quitting: 7.6  . Smokeless tobacco: Never  Vaping Use  . Vaping Use: Some days  . Substances: THC, CBD  Substance and Sexual Activity  . Alcohol use: Yes    Alcohol/week: 0.0 standard drinks of alcohol    Comment: very rare  . Drug use: Yes    Types: Marijuana    Comment: 3-4 times per week  . Sexual activity: Yes    Birth control/protection: Surgical

## 2023-02-19 ENCOUNTER — Ambulatory Visit: Payer: BC Managed Care – PPO | Admitting: Orthopaedic Surgery

## 2023-02-19 ENCOUNTER — Other Ambulatory Visit (INDEPENDENT_AMBULATORY_CARE_PROVIDER_SITE_OTHER): Payer: BC Managed Care – PPO

## 2023-02-19 DIAGNOSIS — M25561 Pain in right knee: Secondary | ICD-10-CM | POA: Diagnosis not present

## 2023-02-19 DIAGNOSIS — G8929 Other chronic pain: Secondary | ICD-10-CM

## 2023-02-19 MED ORDER — METHYLPREDNISOLONE ACETATE 40 MG/ML IJ SUSP
40.0000 mg | INTRAMUSCULAR | Status: AC | PRN
  Administered 2023-02-19: 40 mg via INTRA_ARTICULAR

## 2023-02-19 MED ORDER — BUPIVACAINE HCL 0.5 % IJ SOLN
2.0000 mL | INTRAMUSCULAR | Status: AC | PRN
  Administered 2023-02-19: 2 mL via INTRA_ARTICULAR

## 2023-02-19 MED ORDER — LIDOCAINE HCL 1 % IJ SOLN
2.0000 mL | INTRAMUSCULAR | Status: AC | PRN
  Administered 2023-02-19: 2 mL

## 2023-02-23 ENCOUNTER — Other Ambulatory Visit: Payer: Self-pay | Admitting: Family Medicine

## 2023-02-23 DIAGNOSIS — F3161 Bipolar disorder, current episode mixed, mild: Secondary | ICD-10-CM

## 2023-02-25 ENCOUNTER — Ambulatory Visit
Admission: RE | Admit: 2023-02-25 | Discharge: 2023-02-25 | Disposition: A | Discharge status: Home / Self Care | Payer: PRIVATE HEALTH INSURANCE | Source: Ambulatory Visit | Attending: Family Medicine

## 2023-02-25 DIAGNOSIS — Z1231 Encounter for screening mammogram for malignant neoplasm of breast: Secondary | ICD-10-CM

## 2023-02-28 ENCOUNTER — Other Ambulatory Visit: Payer: Self-pay | Admitting: Family Medicine

## 2023-02-28 DIAGNOSIS — R928 Other abnormal and inconclusive findings on diagnostic imaging of breast: Secondary | ICD-10-CM

## 2023-03-11 ENCOUNTER — Encounter: Payer: Self-pay | Admitting: Orthopaedic Surgery

## 2023-03-12 ENCOUNTER — Other Ambulatory Visit: Payer: Self-pay

## 2023-03-12 DIAGNOSIS — G8929 Other chronic pain: Secondary | ICD-10-CM

## 2023-03-12 NOTE — Telephone Encounter (Signed)
Please order MRI of the right knee to rule out structural abnormalities.

## 2023-03-14 ENCOUNTER — Ambulatory Visit
Admission: RE | Admit: 2023-03-14 | Discharge: 2023-03-14 | Disposition: A | Discharge status: Home / Self Care | Payer: PRIVATE HEALTH INSURANCE | Source: Ambulatory Visit | Attending: Family Medicine | Admitting: Family Medicine

## 2023-03-14 ENCOUNTER — Ambulatory Visit
Admission: RE | Admit: 2023-03-14 | Discharge: 2023-03-14 | Disposition: A | Discharge status: Home / Self Care | Payer: BC Managed Care – PPO | Source: Ambulatory Visit | Attending: Family Medicine | Admitting: Family Medicine

## 2023-03-14 DIAGNOSIS — R928 Other abnormal and inconclusive findings on diagnostic imaging of breast: Secondary | ICD-10-CM

## 2023-03-26 ENCOUNTER — Ambulatory Visit
Admission: RE | Admit: 2023-03-26 | Discharge: 2023-03-26 | Disposition: A | Discharge status: Home / Self Care | Payer: PRIVATE HEALTH INSURANCE | Source: Ambulatory Visit | Attending: Orthopaedic Surgery | Admitting: Orthopaedic Surgery

## 2023-03-26 DIAGNOSIS — G8929 Other chronic pain: Secondary | ICD-10-CM

## 2023-03-29 ENCOUNTER — Ambulatory Visit: Payer: PRIVATE HEALTH INSURANCE | Admitting: Orthopaedic Surgery

## 2023-03-29 ENCOUNTER — Encounter: Payer: Self-pay | Admitting: Orthopaedic Surgery

## 2023-03-29 DIAGNOSIS — S83241A Other tear of medial meniscus, current injury, right knee, initial encounter: Secondary | ICD-10-CM

## 2023-03-29 NOTE — Progress Notes (Signed)
Office Visit Note   Patient: Stephanie Spencer           Date of Birth: 05-05-1961           MRN: [id number] Visit Date: 03/29/2023              Requested by: Stephanie Mast, MD [address] [place],  [place] [address] PCP: Stephanie Mast, MD   Assessment & Plan: Visit Diagnoses:  1. Acute medial meniscus tear of right knee, initial encounter     Plan: MRI of the right knee is consistent with a horizontal tear of the medial meniscus.  Minimal chondromalacia.  Based on these findings I recommended arthroscopic medial meniscal debridement.  She did well from a left knee scope from last year.  Risk benefits prognosis reviewed with the patient again.  Questions encouraged and answered.  Debbie met with the patient today.  Follow-Up Instructions: No follow-ups on file.   Orders:  No orders of the defined types were placed in this encounter.  No orders of the defined types were placed in this encounter.     Procedures: No procedures performed   Clinical Data: No additional findings.   Subjective: Chief Complaint  Patient presents with   Right Knee - Follow-up    MRI review    HPI [name] returns today for MRI review of the right knee. Review of Systems   Objective: Vital Signs: There were no vitals taken for this visit.  Physical Exam  Ortho Exam Examination of the right knee is unchanged. Specialty Comments:  No specialty comments available.  Imaging: No results found.   PMFS History: Patient Active Problem List   Diagnosis Date Noted   Acute medial meniscus tear of right knee 03/29/2023   Impaired fasting glucose 02/06/2023   Obesity (BMI 35.0-39.9 without comorbidity) 08/25/2021   S/P left knee arthroscopy 08/25/2021   Genital herpes 02/24/2021   Recurrent oral herpes simplex 07/25/2014   Bipolar disorder (HCC) 02/24/2014   Rheumatoid arthritis (HCC) 02/24/2014   Past Medical History:  Diagnosis Date   Chicken pox    Chronic  rheumatic arthritis (HCC)    GERD (gastroesophageal reflux disease)    Hemorrhoids    Hyperlipidemia    Borderline   Mumps    RLS (restless legs syndrome)    Urinary incontinence    Leakage   UTI (lower urinary tract infection)     Family History  Problem Relation Age of Onset   Mental illness Mother        Living   Dementia Mother    Parkinson's disease Father        Deceased   Alzheimer's disease Father    Cancer Maternal Grandmother    Diabetes Maternal Uncle    Arthritis Maternal Aunt        Psoriatic    Alcohol abuse Brother    Healthy Brother    Healthy Sister    Allergies Daughter    Healthy Daughter    Healthy Son        x2   Colon cancer Neg Hx     Past Surgical History:  Procedure Laterality Date   ABDOMINAL HYSTERECTOMY  2013   AUGMENTATION MAMMAPLASTY Bilateral 2005   BREAST ENHANCEMENT SURGERY  2005/1992   FOOT SURGERY     Left, [facility]   KNEE ARTHROSCOPY Left    TONSILLECTOMY AND ADENOIDECTOMY  1972   Social History   Occupational History   Occupation: Film/video editor  Comment: [employer]  Tobacco Use   Smoking status: Former    Types: Cigarettes    Quit date: 06/30/2015    Years since quitting: 7.7   Smokeless tobacco: Never  Vaping Use   Vaping Use: Some days   Substances: THC, CBD  Substance and Sexual Activity   Alcohol use: Yes    Alcohol/week: 0.0 standard drinks of alcohol    Comment: very rare   Drug use: Yes    Types: Marijuana    Comment: 3-4 times per week   Sexual activity: Yes    Birth control/protection: Surgical

## 2023-04-02 ENCOUNTER — Other Ambulatory Visit: Payer: Self-pay | Admitting: Physician Assistant

## 2023-04-02 MED ORDER — HYDROCODONE-ACETAMINOPHEN 5-325 MG PO TABS: 1.0000 | ORAL_TABLET | Freq: Three times a day (TID) | ORAL | 0 refills | Status: DC | PRN

## 2023-04-02 MED ORDER — ONDANSETRON HCL 4 MG PO TABS: 4.0000 mg | ORAL_TABLET | Freq: Three times a day (TID) | ORAL | 0 refills | Status: AC | PRN

## 2023-04-04 ENCOUNTER — Encounter: Payer: Self-pay | Admitting: Orthopaedic Surgery

## 2023-04-04 DIAGNOSIS — S83231A Complex tear of medial meniscus, current injury, right knee, initial encounter: Secondary | ICD-10-CM | POA: Diagnosis not present

## 2023-04-11 ENCOUNTER — Ambulatory Visit (INDEPENDENT_AMBULATORY_CARE_PROVIDER_SITE_OTHER): Payer: PRIVATE HEALTH INSURANCE | Admitting: Physician Assistant

## 2023-04-11 DIAGNOSIS — Z9889 Other specified postprocedural states: Secondary | ICD-10-CM

## 2023-04-11 NOTE — Progress Notes (Signed)
Post-Op Visit Note   Patient: Stephanie Spencer           Date of Birth: Apr 03, 1961           MRN: [id number] Visit Date: 04/11/2023 PCP: Loyola Mast, MD   Assessment & Plan:  Chief Complaint:  Chief Complaint  Patient presents with   Right Knee - Routine Post Op   Visit Diagnoses:  1. S/P right knee arthroscopy     Plan: Patient is a pleasant 62 year old female comes in today 1 week status post right knee arthroscopic debridement medial meniscus 04/04/2023.  She has been doing well.  She is minimally tender to the medial knee but overall doing very well.  She is not taking anything for pain.  Examination of her right knee reveals a fully healed surgical portals with nylon sutures in place.  No evidence of infection or cellulitis.  Calves are soft and nontender.  She is neurovascularly intact distally.  Today, sutures were removed and Steri-Strips applied.  Intraoperative pictures reviewed and a home exercise program provided.  She will follow-up with [place] in 5 weeks for recheck.  Call with concerns or questions.  Follow-Up Instructions: Return in about 5 weeks (around 05/16/2023).   Orders:  No orders of the defined types were placed in this encounter.  No orders of the defined types were placed in this encounter.   Imaging: No new imaging  PMFS History: Patient Active Problem List   Diagnosis Date Noted   Acute medial meniscus tear of right knee 03/29/2023   Impaired fasting glucose 02/06/2023   Obesity (BMI 35.0-39.9 without comorbidity) 08/25/2021   S/P left knee arthroscopy 08/25/2021   Genital herpes 02/24/2021   Recurrent oral herpes simplex 07/25/2014   Bipolar disorder (HCC) 02/24/2014   Rheumatoid arthritis (HCC) 02/24/2014   Past Medical History:  Diagnosis Date   Chicken pox    Chronic rheumatic arthritis (HCC)    GERD (gastroesophageal reflux disease)    Hemorrhoids    Hyperlipidemia    Borderline   Mumps    RLS (restless legs syndrome)    Urinary  incontinence    Leakage   UTI (lower urinary tract infection)     Family History  Problem Relation Age of Onset   Mental illness Mother        Living   Dementia Mother    Parkinson's disease Father        Deceased   Alzheimer's disease Father    Cancer Maternal Grandmother    Diabetes Maternal Uncle    Arthritis Maternal Aunt        Psoriatic    Alcohol abuse Brother    Healthy Brother    Healthy Sister    Allergies Daughter    Healthy Daughter    Healthy Son        x2   Colon cancer Neg Hx     Past Surgical History:  Procedure Laterality Date   ABDOMINAL HYSTERECTOMY  2013   AUGMENTATION MAMMAPLASTY Bilateral 2005   BREAST ENHANCEMENT SURGERY  2005/1992   FOOT SURGERY     Left, [place]   KNEE ARTHROSCOPY Left    TONSILLECTOMY AND ADENOIDECTOMY  1972   Social History   Occupational History   Occupation: Film/video editor    Comment: [employer]  Tobacco Use   Smoking status: Former    Types: Cigarettes    Quit date: 06/30/2015    Years since quitting: 7.7   Smokeless tobacco: Never  Vaping Use   Vaping Use: Some days   Substances: THC, CBD  Substance and Sexual Activity   Alcohol use: Yes    Alcohol/week: 0.0 standard drinks of alcohol    Comment: very rare   Drug use: Yes    Types: Marijuana    Comment: 3-4 times per week   Sexual activity: Yes    Birth control/protection: Surgical

## 2023-05-18 IMAGING — MR MR KNEE*L* W/O CM
6 series · 40 of 40 positions shown · non-contrast
Comparison: Left knee radiograph 04/03/2022

CLINICAL DATA: Meniscal injury, knee

EXAM:
MRI OF THE LEFT KNEE WITHOUT CONTRAST
TECHNIQUE: Multiplanar, multisequence MR imaging of the knee was performed. No
intravenous contrast was administered.

[Series 6: T2 fat-sat · axial · left · 4.0mm · 0.50mm/px · z∈[-79,+73]mm · 9 of 36 slices shown (1 of 3)]
[im 1/36]
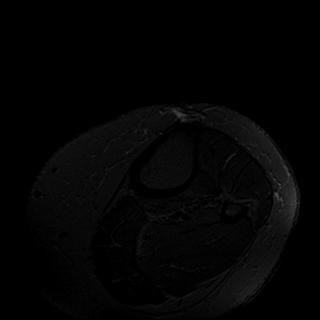
[im 5/36]
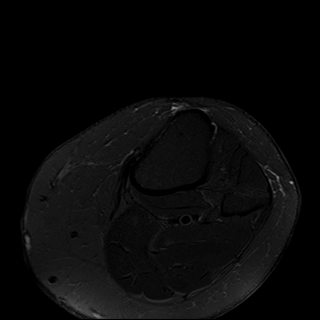
[im 9/36]
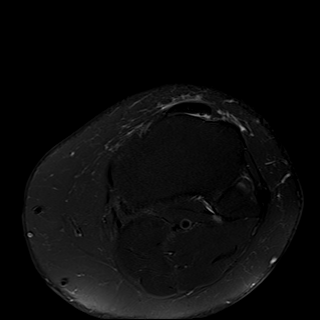
[im 14/36]
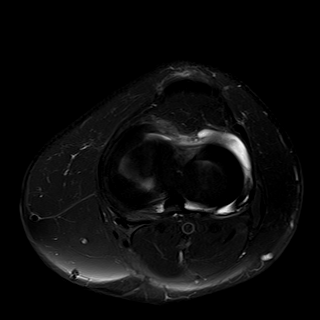
[im 18/36]
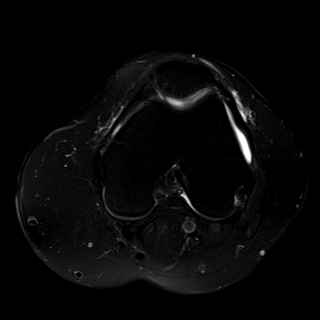
[im 22/36]
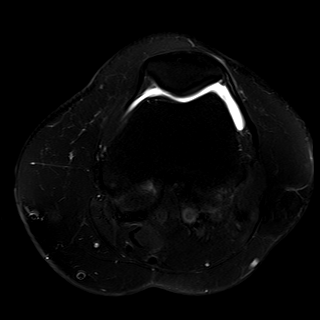
[im 27/36]
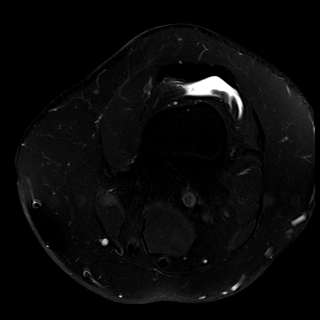
[im 31/36]
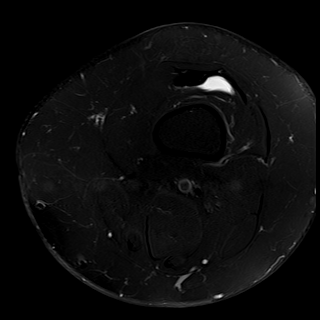
[im 36/36]
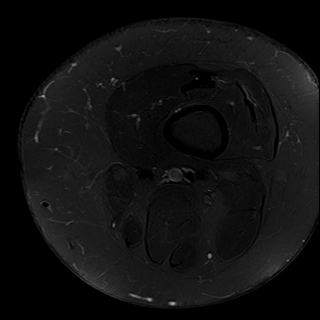

[Series 7: T2 fat-sat · coronal · left · 4.0mm · 0.47mm/px · 6 of 24 slices shown (2 of 3)]
[im 1/24]
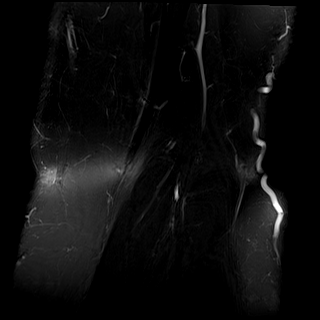
[im 5/24]
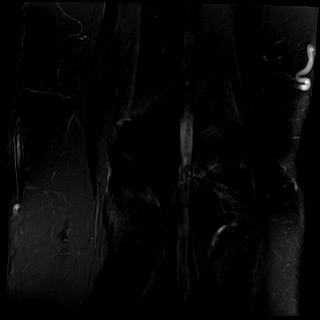
[im 10/24]
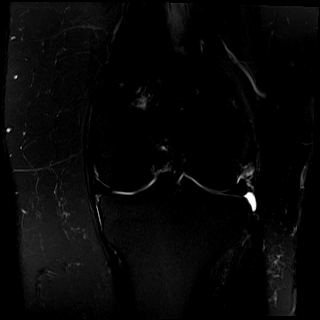
[im 14/24]
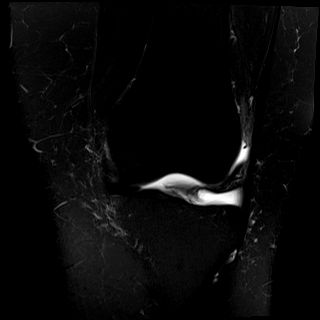
[im 19/24]
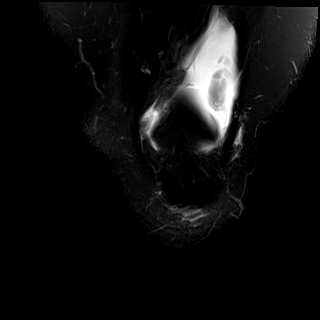
[im 24/24]
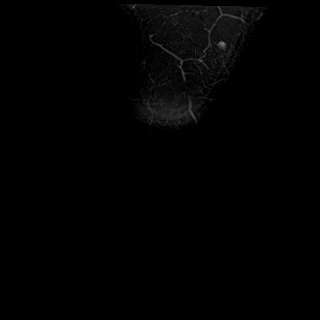

[Series 8: PD fat-sat · sagittal · left · 3.0mm · 0.39mm/px · 6 of 25 slices shown (1 of 2)]
[im 1/25]
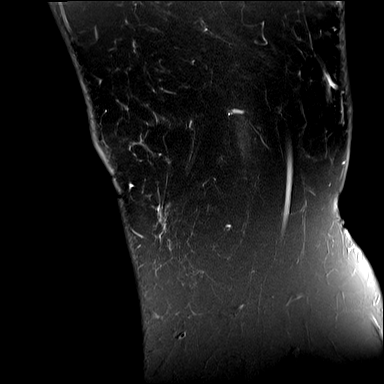
[im 5/25]
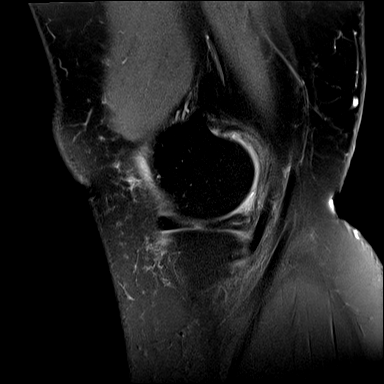
[im 10/25]
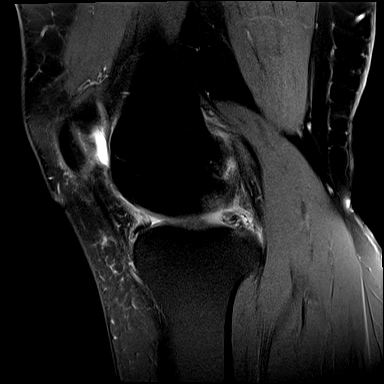
[im 15/25]
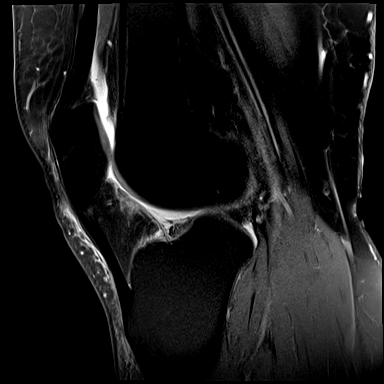
[im 20/25]
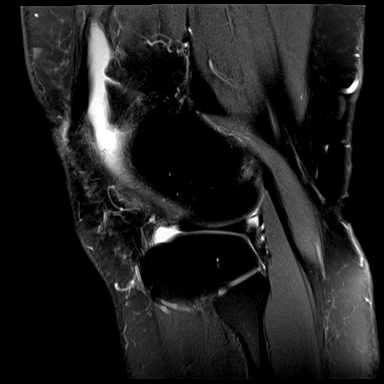
[im 25/25]
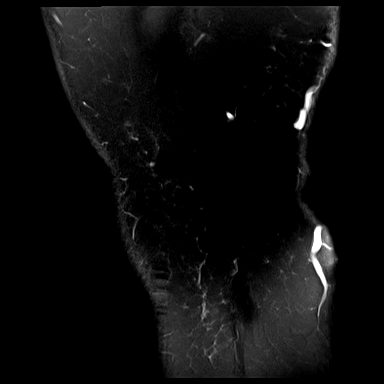

[Series 9: T1 · coronal · left · 4.0mm · 0.47mm/px · 6 of 24 slices shown]
[im 1/24]
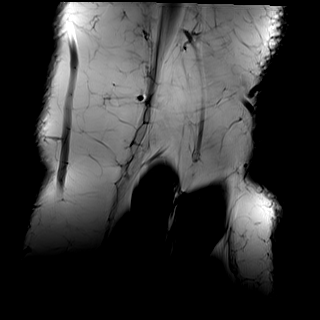
[im 5/24]
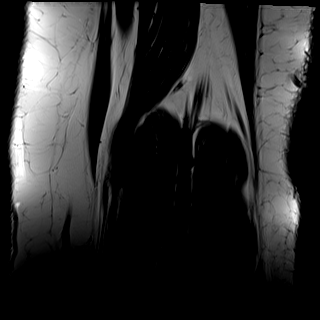
[im 10/24]
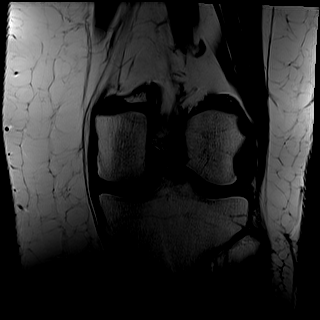
[im 14/24]
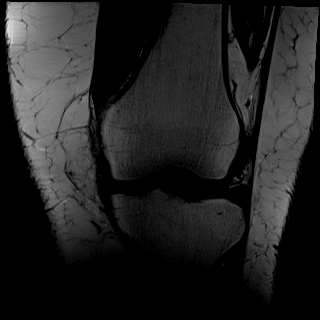
[im 19/24]
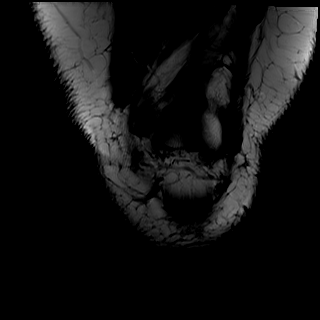
[im 24/24]
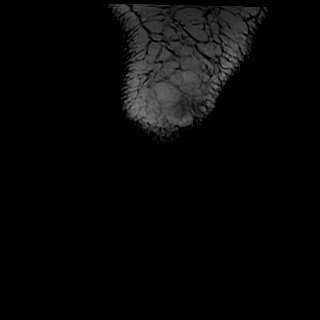

[Series 10: PD fat-sat · coronal · left · 3.0mm · 0.47mm/px · 7 of 28 slices shown (2 of 2)]
[im 1/28]
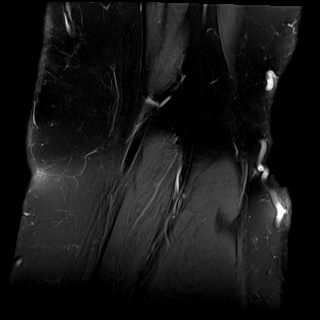
[im 5/28]
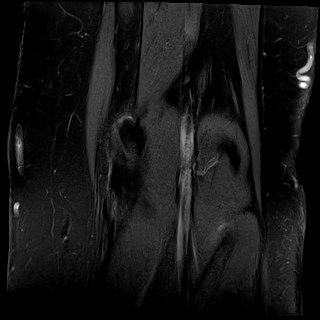
[im 10/28]
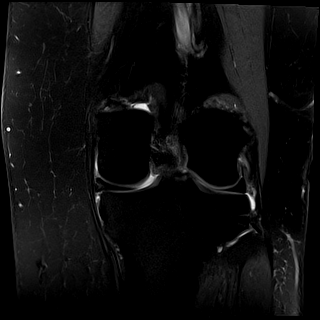
[im 14/28]
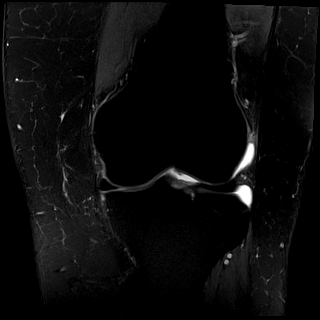
[im 19/28]
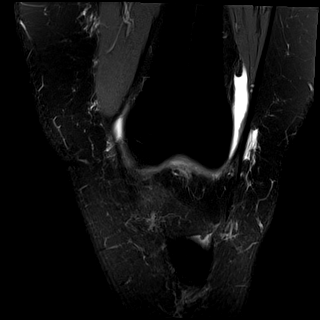
[im 23/28]
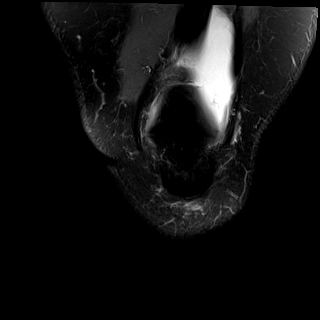
[im 28/28]
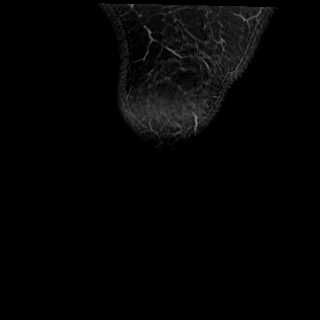

[Series 11: T2 fat-sat · sagittal · left · 3.0mm · 0.39mm/px · 6 of 25 slices shown (3 of 3)]
[im 1/25]
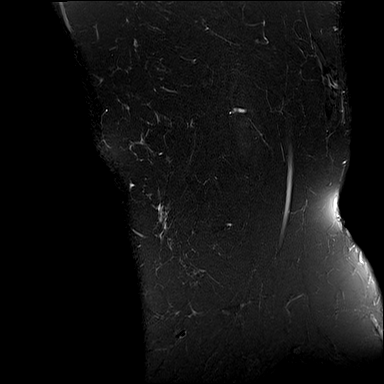
[im 5/25]
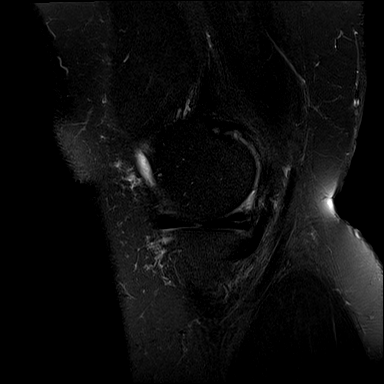
[im 10/25]
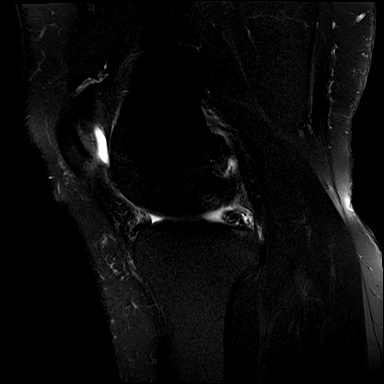
[im 15/25]
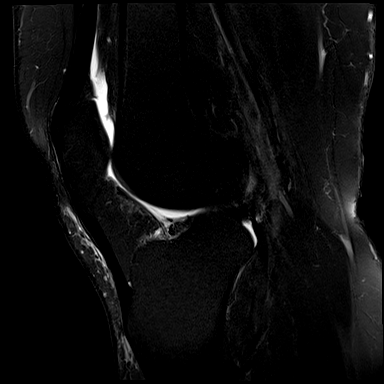
[im 20/25]
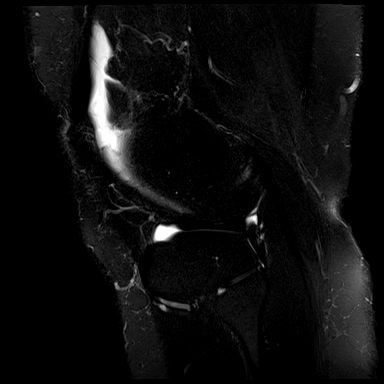
[im 25/25]
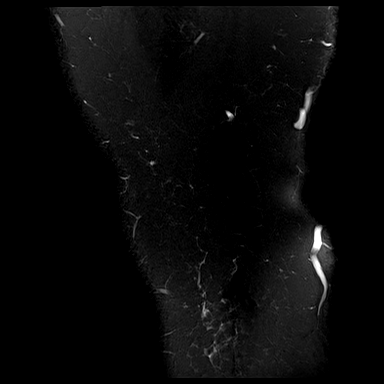

[40 of 40 positions shown; findings below may reference images not displayed]

FINDINGS: MENISCI

Medial: There is horizontal increased signal within the posterior
horn and body of the medial meniscus in the region of prior meniscus
tear. There is new irregularity and increased signal at the
posterior root, new from prior (coronal T2 image 7).

Lateral: Intact lateral meniscus.

LIGAMENTS

Cruciates: ACL and PCL are intact.

Collaterals: Medial collateral ligament is intact. Lateral
collateral ligament complex is intact.

CARTILAGE

Patellofemoral:  No chondral defect.

Medial:  Mild chondrosis.

Lateral:  No chondral defect.

JOINT: Small joint effusion.

POPLITEAL FOSSA: No Baker's cyst.

EXTENSOR MECHANISM: Intact quadriceps tendon. Intact patellar
tendon.

BONES: No aggressive osseous lesion. No fracture or dislocation.

Other: No focal fluid collection.
IMPRESSION: Suspected tearing at the posterior root of the medial meniscus.
Mildly increased horizontal signal within the posterior horn and
body of the medial meniscus in the region of prior meniscus tear,
which could reflect recurrent tearing versus postoperative change.

Mild medial compartment osteoarthritis.

Mild joint effusion.

## 2023-05-21 ENCOUNTER — Ambulatory Visit (INDEPENDENT_AMBULATORY_CARE_PROVIDER_SITE_OTHER): Payer: PRIVATE HEALTH INSURANCE | Admitting: Physician Assistant

## 2023-05-21 ENCOUNTER — Encounter: Payer: Self-pay | Admitting: Physician Assistant

## 2023-05-21 DIAGNOSIS — Z9889 Other specified postprocedural states: Secondary | ICD-10-CM

## 2023-05-21 NOTE — Progress Notes (Signed)
Post-Op Visit Note   Patient: Stephanie Spencer           Date of Birth: 29-Jun-1961           MRN: [id number] Visit Date: 05/21/2023 PCP: Loyola Mast, MD   Assessment & Plan:  Chief Complaint:  Chief Complaint  Patient presents with   Right Knee - Follow-up    Right knee scope 04/04/2023   Visit Diagnoses:  1. S/P right knee arthroscopy     Plan: Patient is a pleasant 62 year old female who comes in today 6 weeks status post right knee arthroscopic debridement medial meniscus date of surgery 04/04/2023.  She has been doing great.  She went to the beach last week and has noticed slight increased pain to the medial knee.  At this point, she will continue to advance with activity as tolerated.  Follow-up as needed.  Call with concerns or questions.  Follow-Up Instructions: Return if symptoms worsen or fail to improve.   Orders:  No orders of the defined types were placed in this encounter.  No orders of the defined types were placed in this encounter.   Imaging: No new imaging  PMFS History: Patient Active Problem List   Diagnosis Date Noted   Acute medial meniscus tear of right knee 03/29/2023   Impaired fasting glucose 02/06/2023   Obesity (BMI 35.0-39.9 without comorbidity) 08/25/2021   S/P left knee arthroscopy 08/25/2021   Genital herpes 02/24/2021   Recurrent oral herpes simplex 07/25/2014   Bipolar disorder (HCC) 02/24/2014   Rheumatoid arthritis (HCC) 02/24/2014   Past Medical History:  Diagnosis Date   Chicken pox    Chronic rheumatic arthritis (HCC)    GERD (gastroesophageal reflux disease)    Hemorrhoids    Hyperlipidemia    Borderline   Mumps    RLS (restless legs syndrome)    Urinary incontinence    Leakage   UTI (lower urinary tract infection)     Family History  Problem Relation Age of Onset   Mental illness Mother        Living   Dementia Mother    Parkinson's disease Father        Deceased   Alzheimer's disease Father    Cancer  Maternal Grandmother    Diabetes Maternal Uncle    Arthritis Maternal Aunt        Psoriatic    Alcohol abuse Brother    Healthy Brother    Healthy Sister    Allergies Daughter    Healthy Daughter    Healthy Son        x2   Colon cancer Neg Hx     Past Surgical History:  Procedure Laterality Date   ABDOMINAL HYSTERECTOMY  2013   AUGMENTATION MAMMAPLASTY Bilateral 2005   BREAST ENHANCEMENT SURGERY  2005/1992   FOOT SURGERY     Left, [facility]   KNEE ARTHROSCOPY Left    TONSILLECTOMY AND ADENOIDECTOMY  1972   Social History   Occupational History   Occupation: Film/video editor    Comment: [employer]  Tobacco Use   Smoking status: Former    Current packs/day: 0.00    Types: Cigarettes    Quit date: 06/30/2015    Years since quitting: 7.8   Smokeless tobacco: Never  Vaping Use   Vaping status: Some Days   Substances: THC, CBD  Substance and Sexual Activity   Alcohol use: Yes    Alcohol/week: 0.0 standard drinks of alcohol    Comment:  very rare   Drug use: Yes    Types: Marijuana    Comment: 3-4 times per week   Sexual activity: Yes    Birth control/protection: Surgical

## 2023-05-24 ENCOUNTER — Other Ambulatory Visit: Payer: Self-pay | Admitting: Family Medicine

## 2023-05-24 DIAGNOSIS — B002 Herpesviral gingivostomatitis and pharyngotonsillitis: Secondary | ICD-10-CM

## 2023-06-28 ENCOUNTER — Other Ambulatory Visit: Payer: Self-pay | Admitting: Family Medicine

## 2023-06-28 DIAGNOSIS — F3161 Bipolar disorder, current episode mixed, mild: Secondary | ICD-10-CM

## 2023-12-03 ENCOUNTER — Other Ambulatory Visit: Payer: Self-pay | Admitting: Family Medicine

## 2023-12-03 DIAGNOSIS — F3161 Bipolar disorder, current episode mixed, mild: Secondary | ICD-10-CM

## 2023-12-03 NOTE — Telephone Encounter (Signed)
Refill request for  Dvaloproex 250 mg LR 02/25/23, #180, 3 rf LOV  02/06/23 FOV   none scheduled.    Please review and advise.  Thanks. Dm/cma

## 2023-12-06 ENCOUNTER — Other Ambulatory Visit: Payer: Self-pay | Admitting: Family Medicine

## 2023-12-06 DIAGNOSIS — F3161 Bipolar disorder, current episode mixed, mild: Secondary | ICD-10-CM

## 2024-04-29 ENCOUNTER — Encounter: Payer: Self-pay | Admitting: Family Medicine

## 2024-05-25 ENCOUNTER — Encounter: Payer: Self-pay | Admitting: Family Medicine

## 2024-06-20 ENCOUNTER — Other Ambulatory Visit: Payer: Self-pay | Admitting: Family Medicine

## 2024-06-20 DIAGNOSIS — F3161 Bipolar disorder, current episode mixed, mild: Secondary | ICD-10-CM

## 2024-07-08 ENCOUNTER — Other Ambulatory Visit: Payer: Self-pay | Admitting: Family Medicine

## 2024-07-08 DIAGNOSIS — Z1231 Encounter for screening mammogram for malignant neoplasm of breast: Secondary | ICD-10-CM

## 2024-07-13 ENCOUNTER — Other Ambulatory Visit: Payer: Self-pay | Admitting: Family Medicine

## 2024-07-13 ENCOUNTER — Ambulatory Visit
Admission: RE | Admit: 2024-07-13 | Discharge: 2024-07-13 | Disposition: A | Discharge status: Home / Self Care | Payer: PRIVATE HEALTH INSURANCE | Source: Ambulatory Visit | Attending: Family Medicine | Admitting: Family Medicine

## 2024-07-13 DIAGNOSIS — Z1231 Encounter for screening mammogram for malignant neoplasm of breast: Secondary | ICD-10-CM

## 2024-10-20 ENCOUNTER — Ambulatory Visit: Payer: Self-pay

## 2024-10-20 NOTE — Telephone Encounter (Signed)
 FYI Only or Action Required?: FYI only for provider: appointment scheduled on 10/21/24.  Patient was last seen in primary care on 02/06/2023 by Thedora Garnette HERO, MD.  Called Nurse Triage reporting Back Pain.  Symptoms began several days ago.  Interventions attempted: Nothing.  Symptoms are: unchanged.  Triage Disposition: See Physician Within 24 Hours  Patient/caregiver understands and will follow disposition?: Yes   Copied from CRM #[id number]. Topic: Clinical - Red Word Triage >> Oct 20, 2024  8:16 AM Mesmerise C wrote: [facility] that prompted transfer to Nurse Triage: Patient has been having sciatic nerve doesn't know if it's spasms in her lower back hasn't slept for the last 2 days and complains of pain as well Reason for Disposition  Numbness in a leg or foot (i.e., loss of sensation)    Denies weakness/numbness  Answer Assessment - Initial Assessment Questions Offered appt today, patient husband declines due to patient being at work.Scheduled with alt prov 10/21/24  Advised call back or ED if symptoms worsen. Pt's husband verbalized understanding.  Pt's husband is not currently with patient, patient is currently at work.  1. ONSET: When did the pain begin? (e.g., minutes, hours, days)     2 days 2. LOCATION: Where does it hurt? (upper, mid or lower back)     Lower back 3. SEVERITY: How bad is the pain?  (e.g., Scale 1-10; mild, moderate, or severe)     severe 4. PATTERN: Is the pain constant? (e.g., yes, no; constant, intermittent)      constant 5. RADIATION: Does the pain shoot into your legs or somewhere else?     Down to legs 6. CAUSE:  What do you think is causing the back pain?      sciatic 7. BACK OVERUSE:  Any recent lifting of heavy objects, strenuous work or exercise?     no 8. MEDICINES: What have you taken so far for the pain? (e.g., nothing, acetaminophen , NSAIDS)     otc 9. NEUROLOGIC SYMPTOMS: Do you have any weakness, numbness, or  problems with bowel/bladder control?     denies 10. OTHER SYMPTOMS: Do you have any other symptoms? (e.g., fever, abdomen pain, burning with urination, blood in urine)       Nausea, vomitedx1 Denies fever chills, abd pain, weakness, numbness  Protocols used: Back Pain-A-AH

## 2024-10-21 ENCOUNTER — Other Ambulatory Visit (HOSPITAL_BASED_OUTPATIENT_CLINIC_OR_DEPARTMENT_OTHER): Payer: Self-pay

## 2024-10-21 ENCOUNTER — Encounter: Payer: Self-pay | Admitting: Family

## 2024-10-21 ENCOUNTER — Ambulatory Visit (INDEPENDENT_AMBULATORY_CARE_PROVIDER_SITE_OTHER): Admitting: Family

## 2024-10-21 VITALS — BP 108/72 | HR 74 | Temp 97.7°F | Resp 16 | Ht 64.0 in | Wt 136.4 lb

## 2024-10-21 DIAGNOSIS — M5416 Radiculopathy, lumbar region: Secondary | ICD-10-CM | POA: Insufficient documentation

## 2024-10-21 MED ORDER — METHYLPREDNISOLONE 4 MG PO TBPK
ORAL_TABLET | ORAL | 0 refills | Status: AC
  Filled 2024-10-21: qty 21, 6d supply, fill #0

## 2024-10-21 MED ORDER — METHOCARBAMOL 500 MG PO TABS
500.0000 mg | ORAL_TABLET | Freq: Three times a day (TID) | ORAL | 0 refills | Status: AC | PRN
  Filled 2024-10-21: qty 20, 7d supply, fill #0

## 2024-10-21 MED ORDER — DICLOFENAC SODIUM 75 MG PO TBEC
75.0000 mg | DELAYED_RELEASE_TABLET | Freq: Two times a day (BID) | ORAL | 0 refills | Status: AC | PRN
  Filled 2024-10-21: qty 30, 15d supply, fill #0

## 2024-10-21 MED ORDER — HYDROCODONE-ACETAMINOPHEN 5-325 MG PO TABS
1.0000 | ORAL_TABLET | Freq: Three times a day (TID) | ORAL | 0 refills | Status: AC | PRN
  Filled 2024-10-21: qty 15, 5d supply, fill #0

## 2024-10-21 NOTE — Assessment & Plan Note (Signed)
" °  Acute on chronic lumbar radiculopathy with bilateral leg pain due to likely bulging disc or spinal stenosis causing nerve compression. Pain not controlled with current medications. No new bowel or bladder incontinence. Constipation likely due to recent use of hydrocodone . She has added miralax prn- ok to continue. - Prescribed Medrol  Dosepak for one week. She notes that steroids make her mean but feels desperate to feel better so she wishes to try.  - Prescribed diclofenac  bid to use as backup if steroids not tolerated. - Prescribed hydrocodone  for severe pain, three times daily as needed, caution advised re: not driving after taking this medication.  - Prescribed methocarbamol  as muscle relaxer, three times daily as needed, caution advised regarding drowsiness. - Advised follow-up with Dr.  Thedora for potential physical therapy referral after symptom improvement. - Discussed potential MRI and surgical consultation if symptoms persist.  "

## 2024-10-21 NOTE — Progress Notes (Signed)
 "  Subjective:     Patient ID: Stephanie Spencer, female    DOB: 1960/12/06, 63 y.o.   MRN: [id number]  Chief Complaint  Patient presents with   Back Pain    Sxs since this weekend, pain going down both legs. Per triage  Patient has been having sciatic nerve doesn't know if it's spasms in her lower back hasn't slept for the last 2 days and complains of pain as well    Back Pain    Discussed the use of AI scribe software for clinical note transcription with the patient, who gave verbal consent to proceed.  History of Present Illness Stephanie Spencer is a 63 year old female who presents with severe lower back pain radiating down both legs.  She has been experiencing severe lower back pain that began on Sunday at 2:00 AM, waking her from sleep. The pain radiates down both legs, including the buttocks, and has occurred approximately 34 times this year. She describes the pain as unbearable and states that it affects her entire spine, although it is primarily concentrated in the lower back.  In the past, she has used hydrocodone  and diclofenac  to manage the pain, but these medications only take the edge off without providing complete relief. She has run out of hydrocodone  and has only a few diclofenac  tablets left. She alternated between these medications during this episode but found them ineffective.  No new incontinence of bowel or bladder is reported. She attributes her current constipation to codeine use and has taken Miralax to address it. The pain has not been associated with any specific injury. The pain is persistent and has not improved with her current medication regimen.      Health Maintenance Due  Topic Date Due   Pneumococcal Vaccine: 50+ Years (1 of 1 - PCV) Never done   DTaP/Tdap/Td (2 - Td or Tdap) 02/20/2024   COVID-19 Vaccine (4 - 2025-26 season) 06/29/2024    Past Medical History:  Diagnosis Date   Chicken pox    Chronic rheumatic arthritis (HCC)    GERD  (gastroesophageal reflux disease)    Hemorrhoids    Hyperlipidemia    Borderline   Mumps    RLS (restless legs syndrome)    Urinary incontinence    Leakage   UTI (lower urinary tract infection)     Past Surgical History:  Procedure Laterality Date   ABDOMINAL HYSTERECTOMY  2013   AUGMENTATION MAMMAPLASTY Bilateral 2005   BREAST ENHANCEMENT SURGERY  2005/1992   FOOT SURGERY     Left, [place]   KNEE ARTHROSCOPY Left    TONSILLECTOMY AND ADENOIDECTOMY  1972    Family History  Problem Relation Age of Onset   Mental illness Mother        Living   Dementia Mother    Parkinson's disease Father        Deceased   Alzheimer's disease Father    Cancer Maternal Grandmother    Diabetes Maternal Uncle    Arthritis Maternal Aunt        Psoriatic    Alcohol abuse Brother    Healthy Brother    Healthy Sister    Allergies Daughter    Healthy Daughter    Healthy Son        x2   Colon cancer Neg Hx     Social History   Socioeconomic History   Marital status: Married    Spouse name: Carlin   Number of children: 4   Years of  education: Not on file   Highest education level: Some college, no degree  Occupational History   Occupation: Film/video editor    Comment: Engineer, [employer]  Tobacco Use   Smoking status: Former    Current packs/day: 0.00    Types: Cigarettes    Quit date: 06/30/2015    Years since quitting: 9.3   Smokeless tobacco: Never  Vaping Use   Vaping status: Some Days   Substances: THC, CBD  Substance and Sexual Activity   Alcohol use: Yes    Alcohol/week: 0.0 standard drinks of alcohol    Comment: very rare   Drug use: Yes    Types: Marijuana    Comment: 3-4 times per week   Sexual activity: Yes    Birth control/protection: Surgical  Other Topics Concern   Not on file  Social History Narrative   Not on file   Social Drivers of Health   Tobacco Use: Medium Risk (10/21/2024)   Patient History    Smoking Tobacco Use: Former    Smokeless Tobacco  Use: Never    Passive Exposure: Not on Actuary Strain: Not on file  Food Insecurity: Not on file  Transportation Needs: Not on file  Physical Activity: Not on file  Stress: Not on file  Social Connections: Not on file  Intimate Partner Violence: Not on file  Depression (PHQ2-9): Low Risk (02/06/2023)   Depression (PHQ2-9)    PHQ-2 Score: 0  Alcohol Screen: Low Risk (10/20/2024)   Alcohol Screen    Last Alcohol Screening Score (AUDIT): 1  Housing: Not on file  Utilities: Not on file  Health Literacy: Not on file    Outpatient Medications Prior to Visit  Medication Sig Dispense Refill   acyclovir  (ZOVIRAX ) 800 MG tablet TAKE 1 TABLET(800 MG) BY MOUTH DAILY 90 tablet 3   ARIPiprazole  (ABILIFY ) 5 MG tablet TAKE 1 TABLET(5 MG) BY MOUTH DAILY 90 tablet 3   Ascorbic Acid (VITAMIN C) 1000 MG tablet Take 1,000 mg by mouth daily.     Bacillus Coagulans-Inulin (PROBIOTIC FORMULA) 1-250 BILLION-MG CAPS Take by mouth daily.     citalopram  (CELEXA ) 20 MG tablet TAKE 1 TABLET(20 MG) BY MOUTH DAILY 90 tablet 3   Cranberry-Vitamin C 84-20 MG CAPS Take by mouth daily.     divalproex  (DEPAKOTE ) 250 MG DR tablet TAKE 1 TABLET(250 MG) BY MOUTH TWICE DAILY 180 tablet 3   Multiple Vitamins-Minerals (CENTRUM ADULTS) TABS Take by mouth daily.     ondansetron  (ZOFRAN ) 4 MG tablet Take 1 tablet (4 mg total) by mouth every 8 (eight) hours as needed for nausea or vomiting. 40 tablet 0   vitamin B-12 (CYANOCOBALAMIN) 500 MCG tablet Take 500 mcg by mouth daily.     HYDROcodone -acetaminophen  (NORCO) 5-325 MG tablet Take 1 tablet by mouth 3 (three) times daily as needed. To be taken after surgery 20 tablet 0   No facility-administered medications prior to visit.    Allergies[1]  Review of Systems  Musculoskeletal:  Positive for back pain.       Objective:    Physical Exam Constitutional:      General: She is not in acute distress.    Appearance: Normal appearance. She is  well-developed.  HENT:     Head: Normocephalic and atraumatic.     Right Ear: External ear normal.     Left Ear: External ear normal.  Eyes:     General: No scleral icterus. Neck:     Thyroid : No  thyromegaly.  Cardiovascular:     Rate and Rhythm: Normal rate and regular rhythm.     Heart sounds: Normal heart sounds. No murmur heard. Pulmonary:     Effort: Pulmonary effort is normal. No respiratory distress.     Breath sounds: Normal breath sounds. No wheezing.  Musculoskeletal:     Cervical back: Neck supple.     Thoracic back: Tenderness present. No swelling.     Lumbar back: Tenderness present. No swelling.  Skin:    General: Skin is warm and dry.  Neurological:     Mental Status: She is alert and oriented to person, place, and time.     Deep Tendon Reflexes:     Reflex Scores:      Patellar reflexes are 2+ on the right side and 3+ on the left side.    Comments: Bilateral LE strength is 5/5  Psychiatric:        Mood and Affect: Mood normal.        Behavior: Behavior normal.        Thought Content: Thought content normal.        Judgment: Judgment normal.      BP 108/72 (BP Location: Right Arm, Patient Position: Sitting)   Pulse 74   Temp 97.7 F (36.5 C) (Oral)   Resp 16   Ht 5' 4 (1.626 m)   Wt 136 lb 6.4 oz (61.9 kg)   SpO2 98%   BMI 23.41 kg/m  Wt Readings from Last 3 Encounters:  10/21/24 136 lb 6.4 oz (61.9 kg)  02/06/23 185 lb 3.2 oz (84 kg)  05/30/22 197 lb 9.6 oz (89.6 kg)       Assessment & Plan:   Problem List Items Addressed This Visit       Unprioritized   Lumbar radiculopathy - Primary    Acute on chronic lumbar radiculopathy with bilateral leg pain due to likely bulging disc or spinal stenosis causing nerve compression. Pain not controlled with current medications. No new bowel or bladder incontinence. Constipation likely due to recent use of hydrocodone . She has added miralax prn- ok to continue. - Prescribed Medrol  Dosepak for one week.  She notes that steroids make her mean but feels desperate to feel better so she wishes to try.  - Prescribed diclofenac  bid to use as backup if steroids not tolerated. - Prescribed hydrocodone  for severe pain, three times daily as needed, caution advised re: not driving after taking this medication.  - Prescribed methocarbamol  as muscle relaxer, three times daily as needed, caution advised regarding drowsiness. - Advised follow-up with Dr.  Thedora for potential physical therapy referral after symptom improvement. - Discussed potential MRI and surgical consultation if symptoms persist.       Relevant Medications   methylPREDNISolone  (MEDROL  DOSEPAK) 4 MG TBPK tablet   methocarbamol  (ROBAXIN ) 500 MG tablet   diclofenac  (VOLTAREN ) 75 MG EC tablet   HYDROcodone -acetaminophen  (NORCO/VICODIN) 5-325 MG tablet   Assessment & Plan     I have changed Stephanie Spencer's HYDROcodone -acetaminophen . I am also having her start on methylPREDNISolone , methocarbamol , and diclofenac . Additionally, I am having her maintain her Cranberry-Vitamin C, vitamin C, Centrum Adults, Probiotic Formula, cyanocobalamin, ondansetron , acyclovir , divalproex , ARIPiprazole , and citalopram .  Meds ordered this encounter  Medications   methylPREDNISolone  (MEDROL  DOSEPAK) 4 MG TBPK tablet    Sig: Take per package instructions (6-day taper).    Dispense:  21 tablet    Refill:  0    Supervising Provider:   DOMENICA BLACKBIRD A [  [id number]]   methocarbamol  (ROBAXIN ) 500 MG tablet    Sig: Take 1 tablet (500 mg total) by mouth every 8 (eight) hours as needed.    Dispense:  20 tablet    Refill:  0    Supervising Provider:   DOMENICA BLACKBIRD A [[id number]]   diclofenac  (VOLTAREN ) 75 MG EC tablet    Sig: Take 1 tablet (75 mg total) by mouth 2 (two) times daily as needed.    Dispense:  30 tablet    Refill:  0    Supervising Provider:   DOMENICA BLACKBIRD A [[id number]]   HYDROcodone -acetaminophen  (NORCO/VICODIN) 5-325 MG tablet    Sig: Take 1 tablet by  mouth 3 (three) times daily as needed. To be taken after surgery    Dispense:  15 tablet    Refill:  0    Supervising Provider:   DOMENICA BLACKBIRD A [[id number]]      [1]  Allergies Allergen Reactions   Lithium Other (See Comments)    Boils   Penicillins Hives   "

## 2024-10-21 NOTE — Patient Instructions (Signed)
" °  VISIT SUMMARY: Today, you were seen for severe lower back pain radiating down both legs, which has been persistent and not relieved by your current medications. You have experienced this pain multiple times this year, and it has significantly affected your daily life.  YOUR PLAN: -LUMBAR RADICULOPATHY: Lumbar radiculopathy is a condition where a nerve in the lower back is pinched, often due to a bulging disc or severe arthritis, causing pain that radiates down the legs. To manage your pain, you have been prescribed a Medrol  Dosepak for one week, diclofenac  as a backup if you cannot tolerate the steroids, hydrocodone  for severe pain to be taken three times daily as needed, and methocarbamol  as a muscle relaxer to be taken three times daily as needed. Please be cautious about driving while taking these medications as they can cause drowsiness. You should follow up with Doctor Thedora for a potential physical therapy referral once your symptoms improve. If your symptoms persist, we may consider an MRI and a surgical consultation.  INSTRUCTIONS: Please follow up with Doctor Thedora for a potential physical therapy referral after your symptoms improve. If your symptoms persist, we may need to consider an MRI and a surgical consultation.                      "

## 2024-12-04 NOTE — Progress Notes (Incomplete)
{  ELSMRTemplates:[id number]}

## 2024-12-07 ENCOUNTER — Ambulatory Visit: Admitting: Family Medicine
